# Patient Record
Sex: Male | Born: 1955
Health system: Southern US, Community
[De-identification: ages and names within clinical notes are randomized; demographics above are authoritative.]

## PROBLEM LIST (undated history)

## (undated) DIAGNOSIS — R569 Unspecified convulsions: Secondary | ICD-10-CM

## (undated) DIAGNOSIS — I10 Essential (primary) hypertension: Secondary | ICD-10-CM

## (undated) HISTORY — PX: OTHER SURGICAL HISTORY: SHX169

---

## 2014-04-28 LAB — CBC AND DIFFERENTIAL: WBC: 6.6

## 2015-09-26 LAB — HM COLONOSCOPY

## 2017-01-02 LAB — LIPID PANEL
Cholesterol: 165 (ref 0–200)
HDL: 47 (ref 35–70)
LDL Cholesterol: 106
Triglycerides: 62 (ref 40–160)

## 2017-02-17 DIAGNOSIS — N471 Phimosis: Secondary | ICD-10-CM | POA: Diagnosis not present

## 2017-03-04 DIAGNOSIS — L400 Psoriasis vulgaris: Secondary | ICD-10-CM | POA: Diagnosis not present

## 2017-04-27 DIAGNOSIS — H40013 Open angle with borderline findings, low risk, bilateral: Secondary | ICD-10-CM | POA: Diagnosis not present

## 2017-04-27 DIAGNOSIS — H524 Presbyopia: Secondary | ICD-10-CM | POA: Diagnosis not present

## 2017-04-27 DIAGNOSIS — H5203 Hypermetropia, bilateral: Secondary | ICD-10-CM | POA: Diagnosis not present

## 2017-06-17 DIAGNOSIS — M79675 Pain in left toe(s): Secondary | ICD-10-CM | POA: Diagnosis not present

## 2017-06-17 DIAGNOSIS — L03032 Cellulitis of left toe: Secondary | ICD-10-CM | POA: Diagnosis not present

## 2017-09-08 DIAGNOSIS — M25511 Pain in right shoulder: Secondary | ICD-10-CM | POA: Diagnosis not present

## 2017-09-08 DIAGNOSIS — M19011 Primary osteoarthritis, right shoulder: Secondary | ICD-10-CM | POA: Diagnosis not present

## 2017-09-17 DIAGNOSIS — M25511 Pain in right shoulder: Secondary | ICD-10-CM | POA: Diagnosis not present

## 2017-09-17 DIAGNOSIS — M19011 Primary osteoarthritis, right shoulder: Secondary | ICD-10-CM | POA: Diagnosis not present

## 2017-10-09 ENCOUNTER — Emergency Department (HOSPITAL_COMMUNITY): Payer: BLUE CROSS/BLUE SHIELD

## 2017-10-09 ENCOUNTER — Other Ambulatory Visit: Payer: Self-pay

## 2017-10-09 ENCOUNTER — Emergency Department (HOSPITAL_COMMUNITY)
Admission: EM | Admit: 2017-10-09 | Discharge: 2017-10-09 | Disposition: A | Discharge status: Home / Self Care | Payer: BLUE CROSS/BLUE SHIELD | Attending: Emergency Medicine | Admitting: Emergency Medicine

## 2017-10-09 DIAGNOSIS — W01198A Fall on same level from slipping, tripping and stumbling with subsequent striking against other object, initial encounter: Secondary | ICD-10-CM | POA: Insufficient documentation

## 2017-10-09 DIAGNOSIS — Y939 Activity, unspecified: Secondary | ICD-10-CM | POA: Insufficient documentation

## 2017-10-09 DIAGNOSIS — M7989 Other specified soft tissue disorders: Secondary | ICD-10-CM | POA: Diagnosis not present

## 2017-10-09 DIAGNOSIS — S6991XA Unspecified injury of right wrist, hand and finger(s), initial encounter: Secondary | ICD-10-CM | POA: Diagnosis not present

## 2017-10-09 DIAGNOSIS — Y92008 Other place in unspecified non-institutional (private) residence as the place of occurrence of the external cause: Secondary | ICD-10-CM | POA: Insufficient documentation

## 2017-10-09 DIAGNOSIS — Y999 Unspecified external cause status: Secondary | ICD-10-CM | POA: Diagnosis not present

## 2017-10-09 MED ORDER — IBUPROFEN 600 MG PO TABS
600.0000 mg | ORAL_TABLET | Freq: Four times a day (QID) | ORAL | 0 refills | Status: DC | PRN
Start: 2017-10-09 — End: 2018-08-04

## 2017-10-09 MED ORDER — IBUPROFEN 400 MG PO TABS
600.0000 mg | ORAL_TABLET | Freq: Once | ORAL | Status: AC
  Administered 2017-10-09: 600 mg via ORAL
  Filled 2017-10-09: qty 1
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
## 2017-10-09 NOTE — Discharge Instructions (Signed)
As discussed, your x-rays did not show any fractures or dislocation today. Follow the rice protocol outlined in these instructions.  Follow-up with your primary care provider next week. Return sooner if symptoms worsen, increased swelling, tightness, loss of sensation, worsening pain or other new concerning symptoms in the meantime.

## 2017-10-22 DIAGNOSIS — S60021A Contusion of right index finger without damage to nail, initial encounter: Secondary | ICD-10-CM | POA: Diagnosis not present

## 2017-12-24 DIAGNOSIS — S60021D Contusion of right index finger without damage to nail, subsequent encounter: Secondary | ICD-10-CM | POA: Diagnosis not present

## 2017-12-24 DIAGNOSIS — S6991XD Unspecified injury of right wrist, hand and finger(s), subsequent encounter: Secondary | ICD-10-CM | POA: Diagnosis not present

## 2017-12-31 DIAGNOSIS — S6991XD Unspecified injury of right wrist, hand and finger(s), subsequent encounter: Secondary | ICD-10-CM | POA: Diagnosis not present

## 2018-01-07 DIAGNOSIS — S60021D Contusion of right index finger without damage to nail, subsequent encounter: Secondary | ICD-10-CM | POA: Diagnosis not present

## 2018-01-07 DIAGNOSIS — S6991XD Unspecified injury of right wrist, hand and finger(s), subsequent encounter: Secondary | ICD-10-CM | POA: Diagnosis not present

## 2018-01-09 DIAGNOSIS — F4320 Adjustment disorder, unspecified: Secondary | ICD-10-CM | POA: Diagnosis not present

## 2018-01-19 ENCOUNTER — Encounter (HOSPITAL_COMMUNITY): Payer: Self-pay

## 2018-01-19 ENCOUNTER — Emergency Department (HOSPITAL_COMMUNITY)
Admission: EM | Admit: 2018-01-19 | Discharge: 2018-01-19 | Disposition: A | Discharge status: Home / Self Care | Payer: BLUE CROSS/BLUE SHIELD | Attending: Emergency Medicine | Admitting: Emergency Medicine

## 2018-01-19 ENCOUNTER — Other Ambulatory Visit: Payer: Self-pay

## 2018-01-19 DIAGNOSIS — I10 Essential (primary) hypertension: Secondary | ICD-10-CM | POA: Insufficient documentation

## 2018-01-19 DIAGNOSIS — R001 Bradycardia, unspecified: Secondary | ICD-10-CM | POA: Diagnosis not present

## 2018-01-19 DIAGNOSIS — Z79899 Other long term (current) drug therapy: Secondary | ICD-10-CM | POA: Insufficient documentation

## 2018-01-19 DIAGNOSIS — S63410A Traumatic rupture of collateral ligament of right index finger at metacarpophalangeal and interphalangeal joint, initial encounter: Secondary | ICD-10-CM | POA: Diagnosis not present

## 2018-01-19 DIAGNOSIS — Y999 Unspecified external cause status: Secondary | ICD-10-CM | POA: Diagnosis not present

## 2018-01-19 DIAGNOSIS — S63650A Sprain of metacarpophalangeal joint of right index finger, initial encounter: Secondary | ICD-10-CM | POA: Diagnosis not present

## 2018-01-19 DIAGNOSIS — R55 Syncope and collapse: Secondary | ICD-10-CM | POA: Diagnosis not present

## 2018-01-19 DIAGNOSIS — X58XXXA Exposure to other specified factors, initial encounter: Secondary | ICD-10-CM | POA: Diagnosis not present

## 2018-01-19 DIAGNOSIS — R0902 Hypoxemia: Secondary | ICD-10-CM | POA: Diagnosis not present

## 2018-01-19 DIAGNOSIS — G8918 Other acute postprocedural pain: Secondary | ICD-10-CM | POA: Diagnosis not present

## 2018-01-19 HISTORY — DX: Unspecified convulsions: R56.9

## 2018-01-19 HISTORY — DX: Essential (primary) hypertension: I10

## 2018-01-19 LAB — BASIC METABOLIC PANEL
Anion gap: 8 (ref 5–15)
BUN: 12 mg/dL (ref 6–20)
CO2: 27 mmol/L (ref 22–32)
Calcium: 8.9 mg/dL (ref 8.9–10.3)
Chloride: 104 mmol/L (ref 101–111)
Creatinine, Ser: 1.09 mg/dL (ref 0.61–1.24)
GFR calc Af Amer: >60 mL/min (ref >60)
GFR calc non Af Amer: >60 mL/min (ref >60)
Glucose, Bld: 105 mg/dL — ABNORMAL HIGH (ref 65–99)
Potassium: 3.4 mmol/L — ABNORMAL LOW (ref 3.5–5.1)
Sodium: 139 mmol/L (ref 135–145)

## 2018-01-19 LAB — URINALYSIS, ROUTINE W REFLEX MICROSCOPIC
Bilirubin Urine: NEGATIVE
Glucose, UA: NEGATIVE mg/dL
Hgb urine dipstick: NEGATIVE
Ketones, ur: NEGATIVE mg/dL
Leukocytes, UA: NEGATIVE
Nitrite: NEGATIVE
Protein, ur: NEGATIVE mg/dL
Specific Gravity, Urine: 1.015 (ref 1.005–1.030)
pH: 6 (ref 5.0–8.0)

## 2018-01-19 LAB — CBC
HCT: 42.5 % (ref 39.0–52.0)
Hemoglobin: 14.7 g/dL (ref 13.0–17.0)
MCH: 32 pg (ref 26.0–34.0)
MCHC: 34.6 g/dL (ref 30.0–36.0)
MCV: 92.6 fL (ref 78.0–100.0)
Platelets: 249 10*3/uL (ref 150–400)
RBC: 4.59 MIL/uL (ref 4.22–5.81)
RDW: 12.8 % (ref 11.5–15.5)
WBC: 7.6 10*3/uL (ref 4.0–10.5)

## 2018-01-19 LAB — TROPONIN I: Troponin I: <0.03 ng/mL (ref <0.03)

## 2018-01-19 LAB — CBG MONITORING, ED: Glucose-Capillary: 93 mg/dL (ref 65–99)

## 2018-01-19 MED ORDER — SODIUM CHLORIDE 0.9 % IV BOLUS
500.0000 mL | Freq: Once | INTRAVENOUS | Status: AC
  Administered 2018-01-19: 500 mL via INTRAVENOUS
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
## 2018-01-19 NOTE — ED Notes (Signed)
Pt provided with cup of ice water.  ?

## 2018-01-19 NOTE — ED Provider Notes (Signed)
[facility] EMERGENCY DEPARTMENT Provider Note   CSN: [id number] Arrival date & time: 01/19/18  1536     History   Chief Complaint Chief Complaint  Patient presents with  . Loss of Consciousness    HPI Lucifer Soja is a 62 y.o. male.  HPI Patient presents after syncopal episode.  Had operation on the ligaments on his finger by Dr. Orlan Leavens earlier today.  Had alfentanil propofol Versed Robaxin and 5 mg oxycodone.  Also some lidocaine.  Had not eaten today.  Went home and began to feel lightheaded and feel as if he was going to pass out.  Felt the room spinning.No chest pain.  Abrasion to left knee.  Did not hit his head.  Was orthostatic for EMS.  Had fluid bolus.  Feeling better now.  States he is thirsty however. Past Medical History:  Diagnosis Date  . Hypertension   . Seizures (HCC)     There are no active problems to display for this patient.   History reviewed. No pertinent surgical history.      Home Medications    Prior to Admission medications   Medication Sig Start Date End Date Taking? Authorizing Provider  amLODipine (NORVASC) 10 MG tablet Take 10 mg by mouth daily. 11/05/17  Yes [provider]  labetalol (NORMODYNE) 100 MG tablet Take 200 mg by mouth daily.  11/05/17  Yes [provider]  lamoTRIgine (LAMICTAL) 150 MG tablet Take 300 mg by mouth daily.  11/05/17  Yes [provider]  losartan-hydrochlorothiazide (HYZAAR) 50-12.5 MG tablet Take 2 tablets by mouth daily.  11/05/17  Yes [provider]  ibuprofen (ADVIL,MOTRIN) 600 MG tablet Take 1 tablet (600 mg total) by mouth every 6 (six) hours as needed. Patient not taking: Reported on 01/19/2018 10/09/17   Gregary Cromer    Family History History reviewed. No pertinent family history.  Social History Social History   Tobacco Use  . Smoking status: Never Smoker  . Smokeless tobacco: Never Used  Substance Use Topics  . Alcohol use: Yes   Alcohol/week: 1.8 oz    Types: 2 Glasses of wine, 1 Shots of liquor per week    Comment: socially wine or gin & tonic  . Drug use: Never     Allergies   Patient has no known allergies.   Review of Systems Review of Systems  Constitutional: Negative for appetite change.  HENT: Negative for congestion.   Respiratory: Negative for shortness of breath.   Cardiovascular: Negative for chest pain.  Gastrointestinal: Negative for abdominal pain.  Genitourinary: Negative for flank pain.  Musculoskeletal: Negative for back pain.  Skin: Negative for rash.  Neurological: Positive for dizziness and light-headedness.  Psychiatric/Behavioral: Negative for confusion.     Physical Exam Updated Vital Signs BP 131/85   Pulse 64   Temp 98.1 F (36.7 C)   Resp 13   Ht 6' (1.829 m)   Wt 117.9 kg (260 lb)   SpO2 96%   BMI 35.26 kg/m   Physical Exam  Constitutional: He appears well-developed.  HENT:  Head: Normocephalic.  Eyes: EOM are normal.  Neck: Neck supple.  Cardiovascular: Normal rate.  Abdominal: Soft. There is no tenderness.  Musculoskeletal:  Right hand and forearm in cast.  Neurological: He is alert.  Awake and appropriate.  Finger-nose intact on left side.  Has nystagmus, particularly with gaze to the right.     ED Treatments / Results  Labs (all labs ordered are listed,  but only abnormal results are displayed) Labs Reviewed  BASIC METABOLIC PANEL - Abnormal; Notable for the following components:      Result Value   Potassium 3.4 (*)    Glucose, Bld 105 (*)    All other components within normal limits  CBC  URINALYSIS, ROUTINE W REFLEX MICROSCOPIC  TROPONIN I  CBG MONITORING, ED    EKG EKG Interpretation  Date/Time:  Wednesday January 19 2018 15:42:12 EDT Ventricular Rate:  53 PR Interval:    QRS Duration: 106 QT Interval:  476 QTC Calculation: 447 R Axis:   -2 Text Interpretation:  Sinus rhythm Prolonged PR interval Abnormal R-wave progression, early  transition Confirmed by Benjiman CorePickering, Ryheem Jay [id number]) on 01/19/2018 3:48:25 PM   Radiology No results found.  Procedures Procedures (including critical care time)  Medications Ordered in ED Medications  sodium chloride 0.9 % bolus 500 mL (0 mLs Intravenous Stopped 01/19/18 1802)     Initial Impression / Assessment and Plan / ED Course  I have reviewed the triage vital signs and the nursing notes.  Pertinent labs & imaging results that were available during my care of the patient were reviewed by me and considered in my medical decision making (see chart for details).     Patient with syncopal episode.  Likely due to combination of medications and decreased oral intake.  EKG lab work reassuring.  Doubt this is ischemic or arrhythmia as the cause.  Dizziness does not appear to be stroke.  Feels better after fluids and ambulated in the ER.  Discharge home.  Final Clinical Impressions(s) / ED Diagnoses   Final diagnoses:  Syncope, unspecified syncope type    ED Discharge Orders    None      Benjiman CorePickering, Syenna Nazir, MD 01/19/18 2350

## 2018-01-19 NOTE — ED Notes (Signed)
Patient able to walk by himself with a steady gait in the hallway.

## 2018-01-19 NOTE — ED Notes (Signed)
ED Provider at bedside. 

## 2018-02-04 DIAGNOSIS — S5320XA Traumatic rupture of unspecified radial collateral ligament, initial encounter: Secondary | ICD-10-CM | POA: Diagnosis not present

## 2018-02-09 DIAGNOSIS — F4320 Adjustment disorder, unspecified: Secondary | ICD-10-CM | POA: Diagnosis not present

## 2018-02-11 ENCOUNTER — Ambulatory Visit: Payer: BLUE CROSS/BLUE SHIELD | Admitting: Family Medicine

## 2018-02-11 ENCOUNTER — Encounter: Payer: Self-pay | Admitting: Family Medicine

## 2018-02-11 VITALS — BP 138/88 | HR 78 | Temp 98.5°F | Ht 72.0 in | Wt 260.3 lb

## 2018-02-11 DIAGNOSIS — I10 Essential (primary) hypertension: Secondary | ICD-10-CM

## 2018-02-11 DIAGNOSIS — Z23 Encounter for immunization: Secondary | ICD-10-CM | POA: Diagnosis not present

## 2018-02-11 DIAGNOSIS — R739 Hyperglycemia, unspecified: Secondary | ICD-10-CM

## 2018-02-11 DIAGNOSIS — Z789 Other specified health status: Secondary | ICD-10-CM | POA: Diagnosis not present

## 2018-02-11 DIAGNOSIS — E785 Hyperlipidemia, unspecified: Secondary | ICD-10-CM | POA: Diagnosis not present

## 2018-02-11 DIAGNOSIS — R569 Unspecified convulsions: Secondary | ICD-10-CM | POA: Insufficient documentation

## 2018-02-11 DIAGNOSIS — Z1159 Encounter for screening for other viral diseases: Secondary | ICD-10-CM

## 2018-02-11 DIAGNOSIS — Z86010 Personal history of colonic polyps: Secondary | ICD-10-CM | POA: Diagnosis not present

## 2018-02-11 DIAGNOSIS — I440 Atrioventricular block, first degree: Secondary | ICD-10-CM | POA: Diagnosis not present

## 2018-02-11 DIAGNOSIS — Z114 Encounter for screening for human immunodeficiency virus [HIV]: Secondary | ICD-10-CM | POA: Diagnosis not present

## 2018-02-11 DIAGNOSIS — Z1211 Encounter for screening for malignant neoplasm of colon: Secondary | ICD-10-CM | POA: Diagnosis not present

## 2018-02-11 MED ORDER — LABETALOL HCL 100 MG PO TABS: 100.0000 mg | ORAL_TABLET | Freq: Two times a day (BID) | ORAL | 3 refills | Status: DC

## 2018-02-11 MED ORDER — AMLODIPINE BESYLATE 10 MG PO TABS: 10.0000 mg | ORAL_TABLET | Freq: Every day | ORAL | 3 refills | Status: DC

## 2018-02-11 MED ORDER — LOSARTAN POTASSIUM-HCTZ 50-12.5 MG PO TABS: 2.0000 | ORAL_TABLET | Freq: Every day | ORAL | 3 refills | Status: DC

## 2018-02-11 MED ORDER — LAMOTRIGINE 150 MG PO TABS: 300.0000 mg | ORAL_TABLET | Freq: Every day | ORAL | 3 refills | Status: DC
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
## 2018-02-11 NOTE — Patient Instructions (Addendum)
Health Maintenance Due  Topic Date Due  . Hepatitis C Screening - will review records 09-17-1955  . HIV Screening - will review records 12/17/1972  . TETANUS/TDAP - Sign release of information at the check out desk for all immunizations plus office notes and labs for last 3 years from your primary care doctor 12/17/1976  . COLONOSCOPY - Sign release of information at the check out desk or with [facility] for all colonoscopies and pathology reports. I am trying to figure out why they recommend every 5 year colonoscopies.  12/18/2007   I refilled all medicines for a year but I would like for you to come fasting to a visit for my next available physical- schedule this at the desk before you leave. Hopefully by then we have all your records in.   Continue antibiotic and glad you have follow up with hand surgery on Monday.   Wt Readings from Last 3 Encounters:  02/11/18 260 lb 4.8 oz (118.1 kg)  01/19/18 260 lb (117.9 kg)  once your hand heals up I want you to set a goal of getting down to at least 250 if not lower by follow up with me. 150 minutes of exercise per week recommended along with healthy diet such as mediterranean diet. My hope is that we can keep you off cholesterol medicines and lower your risk of diabetes by making these changes   Mediterranean Diet A Mediterranean diet refers to food and lifestyle choices that are based on the traditions of countries located on the [employer]. This way of eating has been shown to help prevent certain conditions and improve outcomes for people who have chronic diseases, like kidney disease and heart disease. What are tips for following this plan? Lifestyle  Cook and eat meals together with your family, when possible.  Drink enough fluid to keep your urine clear or pale yellow.  Be physically active every day. This includes: ? Aerobic exercise like running or swimming. ? Leisure activities like gardening, walking, or housework.  Get 7-8 hours  of sleep each night.  If recommended by your health care provider, drink red wine in moderation. This means 1 glass a day for nonpregnant women and 2 glasses a day for men. A glass of wine equals 5 oz (150 mL). Reading food labels  Check the serving size of packaged foods. For foods such as rice and pasta, the serving size refers to the amount of cooked product, not dry.  Check the total fat in packaged foods. Avoid foods that have saturated fat or trans fats.  Check the ingredients list for added sugars, such as corn syrup. Shopping  At the grocery store, buy most of your food from the areas near the walls of the store. This includes: ? Fresh fruits and vegetables (produce). ? Grains, beans, nuts, and seeds. Some of these may be available in unpackaged forms or large amounts (in bulk). ? Fresh seafood. ? Poultry and eggs. ? Low-fat dairy products.  Buy whole ingredients instead of prepackaged foods.  Buy fresh fruits and vegetables in-season from local farmers markets.  Buy frozen fruits and vegetables in resealable bags.  If you do not have access to quality fresh seafood, buy precooked frozen shrimp or canned fish, such as tuna, salmon, or sardines.  Buy small amounts of raw or cooked vegetables, salads, or olives from the deli or salad bar at your store.  Stock your pantry so you always have certain foods on hand, such as olive oil, canned  tuna, canned tomatoes, rice, pasta, and beans. Cooking  Cook foods with extra-virgin olive oil instead of using butter or other vegetable oils.  Have meat as a side dish, and have vegetables or grains as your main dish. This means having meat in small portions or adding small amounts of meat to foods like pasta or stew.  Use beans or vegetables instead of meat in common dishes like chili or lasagna.  Experiment with different cooking methods. Try roasting or broiling vegetables instead of steaming or sauteing them.  Add frozen  vegetables to soups, stews, pasta, or rice.  Add nuts or seeds for added healthy fat at each meal. You can add these to yogurt, salads, or vegetable dishes.  Marinate fish or vegetables using olive oil, lemon juice, garlic, and fresh herbs. Meal planning  Plan to eat 1 vegetarian meal one day each week. Try to work up to 2 vegetarian meals, if possible.  Eat seafood 2 or more times a week.  Have healthy snacks readily available, such as: ? Vegetable sticks with hummus. ? [place] yogurt. ? Fruit and nut trail mix.  Eat balanced meals throughout the week. This includes: ? Fruit: 2-3 servings a day ? Vegetables: 4-5 servings a day ? Low-fat dairy: 2 servings a day ? Fish, poultry, or lean meat: 1 serving a day ? Beans and legumes: 2 or more servings a week ? Nuts and seeds: 1-2 servings a day ? Whole grains: 6-8 servings a day ? Extra-virgin olive oil: 3-4 servings a day  Limit red meat and sweets to only a few servings a month What are my food choices?  Mediterranean diet ? Recommended ? Grains: Whole-grain pasta. Brown rice. Bulgar wheat. Polenta. Couscous. Whole-wheat bread. Orpah Cobbatmeal. Quinoa. ? Vegetables: Artichokes. Beets. Broccoli. Cabbage. Carrots. Eggplant. Green beans. Chard. Kale. Spinach. Onions. Leeks. Peas. Squash. Tomatoes. Peppers. Radishes. ? Fruits: Apples. Apricots. Avocado. Berries. Bananas. Cherries. Dates. Figs. Grapes. Lemons. Melon. Oranges. Peaches. Plums. Pomegranate. ? Meats and other protein foods: Beans. Almonds. Sunflower seeds. Pine nuts. Peanuts. Cod. Salmon. Scallops. Shrimp. Tuna. Tilapia. Clams. Oysters. Eggs. ? Dairy: Low-fat milk. Cheese. Greek yogurt. ? Beverages: Water. Red wine. Herbal tea. ? Fats and oils: Extra virgin olive oil. Avocado oil. Grape seed oil. ? Sweets and desserts: [place] yogurt with honey. Baked apples. Poached pears. Trail mix. ? Seasoning and other foods: Basil. Cilantro. Coriander. Cumin. Mint. Parsley. Sage. Rosemary.  Tarragon. Garlic. Oregano. Thyme. Pepper. Balsalmic vinegar. Tahini. Hummus. Tomato sauce. Olives. Mushrooms. ? Limit these ? Grains: Prepackaged pasta or rice dishes. Prepackaged cereal with added sugar. ? Vegetables: Deep fried potatoes (french fries). ? Fruits: Fruit canned in syrup. ? Meats and other protein foods: Beef. Pork. Lamb. Poultry with skin. Hot dogs. Tomasa BlaseBacon. ? Dairy: Ice cream. Sour cream. Whole milk. ? Beverages: Juice. Sugar-sweetened soft drinks. Beer. Liquor and spirits. ? Fats and oils: Butter. Canola oil. Vegetable oil. Beef fat (tallow). Lard. ? Sweets and desserts: Cookies. Cakes. Pies. Candy. ? Seasoning and other foods: Mayonnaise. Premade sauces and marinades. ? The items listed may not be a complete list. Talk with your dietitian about what dietary choices are right for you. Summary  The Mediterranean diet includes both food and lifestyle choices.  Eat a variety of fresh fruits and vegetables, beans, nuts, seeds, and whole grains.  Limit the amount of red meat and sweets that you eat.  Talk with your health care provider about whether it is safe for you to drink red wine in moderation. This means  1 glass a day for nonpregnant women and 2 glasses a day for men. A glass of wine equals 5 oz (150 mL). This information is not intended to replace advice given to you by your health care provider. Make sure you discuss any questions you have with your health care provider. Document Released: 03/12/2016 Document Revised: 04/14/2016 Document Reviewed: 03/12/2016 Elsevier Interactive Patient Education  [employer].

## 2018-02-12 DIAGNOSIS — R739 Hyperglycemia, unspecified: Secondary | ICD-10-CM | POA: Insufficient documentation

## 2018-02-12 DIAGNOSIS — I440 Atrioventricular block, first degree: Secondary | ICD-10-CM | POA: Insufficient documentation

## 2018-02-12 DIAGNOSIS — Z860100 Personal history of colon polyps, unspecified: Secondary | ICD-10-CM | POA: Insufficient documentation

## 2018-02-12 DIAGNOSIS — E785 Hyperlipidemia, unspecified: Secondary | ICD-10-CM | POA: Insufficient documentation

## 2018-02-12 DIAGNOSIS — Z789 Other specified health status: Secondary | ICD-10-CM | POA: Insufficient documentation

## 2018-02-12 NOTE — Assessment & Plan Note (Signed)
S: controlled on Labetalol 100 BID (he was actually taking all at once but we discussed half life of drug and that he should take BID), losartan-hctz 50-12.5mg  BP Readings from Last 3 Encounters:  02/11/18 138/88  01/19/18 131/85  10/09/17 117/82  A/P: We discussed blood pressure goal of <140/90. Continue current meds:  Refilled all of these.

## 2018-02-12 NOTE — Assessment & Plan Note (Signed)
Wife HCPOA Britta MccreedyBarbara and daughter Cala BradfordKimberly and son Lodema Honghilipp as back up. to be scanned in. Full code
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
## 2018-02-12 NOTE — Assessment & Plan Note (Addendum)
Hyperglycemia on old labs. Get a1c at CPE as well as cbg. Discussed importance of weight loss

## 2018-02-12 NOTE — Assessment & Plan Note (Signed)
On records only see hyperplastic polyps but was advised q5 year colonoscopy- will try to get records to understand this better. Perhaps had adenomatous polyp in the past.

## 2018-02-12 NOTE — Assessment & Plan Note (Addendum)
S: poorly controlled on no rx A/P: mild LDL elevations- we will get these abstracted but also likely update at CPE and update 10 year ascvd risks  Patient also brings an old EKG and asks me to compare to one from ER recently. Both show poor r wave progression. Patient is asymptomatic- will not pursue cardiac workup at this time. Does have first degree block- no major concern

## 2018-02-13 ENCOUNTER — Encounter: Payer: Self-pay | Admitting: Family Medicine

## 2018-02-14 ENCOUNTER — Encounter: Payer: Self-pay | Admitting: Family Medicine

## 2018-03-01 DIAGNOSIS — F4320 Adjustment disorder, unspecified: Secondary | ICD-10-CM | POA: Diagnosis not present

## 2018-03-04 DIAGNOSIS — S5320XD Traumatic rupture of unspecified radial collateral ligament, subsequent encounter: Secondary | ICD-10-CM | POA: Diagnosis not present

## 2018-03-11 DIAGNOSIS — S5320XA Traumatic rupture of unspecified radial collateral ligament, initial encounter: Secondary | ICD-10-CM | POA: Diagnosis not present

## 2018-03-17 DIAGNOSIS — S5320XD Traumatic rupture of unspecified radial collateral ligament, subsequent encounter: Secondary | ICD-10-CM | POA: Diagnosis not present

## 2018-03-21 ENCOUNTER — Encounter: Payer: Self-pay | Admitting: Family Medicine

## 2018-03-25 DIAGNOSIS — S5320XD Traumatic rupture of unspecified radial collateral ligament, subsequent encounter: Secondary | ICD-10-CM | POA: Diagnosis not present

## 2018-03-29 DIAGNOSIS — S60021D Contusion of right index finger without damage to nail, subsequent encounter: Secondary | ICD-10-CM | POA: Diagnosis not present

## 2018-03-29 DIAGNOSIS — M25512 Pain in left shoulder: Secondary | ICD-10-CM | POA: Diagnosis not present

## 2018-03-29 DIAGNOSIS — M19011 Primary osteoarthritis, right shoulder: Secondary | ICD-10-CM | POA: Diagnosis not present

## 2018-03-29 DIAGNOSIS — M19012 Primary osteoarthritis, left shoulder: Secondary | ICD-10-CM | POA: Diagnosis not present

## 2018-04-01 DIAGNOSIS — S5320XD Traumatic rupture of unspecified radial collateral ligament, subsequent encounter: Secondary | ICD-10-CM | POA: Diagnosis not present

## 2018-04-22 ENCOUNTER — Encounter: Payer: BLUE CROSS/BLUE SHIELD | Admitting: Family Medicine

## 2018-04-25 ENCOUNTER — Telehealth: Payer: Self-pay | Admitting: Family Medicine

## 2018-04-25 MED ORDER — LABETALOL HCL 100 MG PO TABS: 100.0000 mg | ORAL_TABLET | Freq: Two times a day (BID) | ORAL | 3 refills | Status: DC

## 2018-04-25 MED ORDER — AMLODIPINE BESYLATE 10 MG PO TABS: 10.0000 mg | ORAL_TABLET | Freq: Every day | ORAL | 3 refills | Status: DC

## 2018-04-25 MED ORDER — LOSARTAN POTASSIUM-HCTZ 50-12.5 MG PO TABS
2.0000 | ORAL_TABLET | Freq: Every day | ORAL | 3 refills | Status: DC
Start: 2018-04-25 — End: 2018-09-07

## 2018-04-25 MED ORDER — LAMOTRIGINE 150 MG PO TABS: 300.0000 mg | ORAL_TABLET | Freq: Every day | ORAL | 3 refills | Status: DC

## 2018-04-25 NOTE — Telephone Encounter (Signed)
Copied from CRM [id number]. Topic: Quick Communication - Rx Refill/Question >> Apr 25, 2018  9:19 AM Angela NevinWilliams, Candice N wrote: Medication: amLODipine (NORVASC) 10 MG tablet, labetalol (NORMODYNE) 100 MG tablet, lamoTRIgine (LAMICTAL) 150 MG tablet and losartan-hydrochlorothiazide (HYZAAR) 50-12.5 MG tablet    Preferred Pharmacy (with phone number or street name): [facility] [place] [address] [phone] (Phone) [phone] (Fax)    Agent: Please be advised that RX refills may take up to 3 business days. We ask that you follow-up with your pharmacy.

## 2018-05-05 ENCOUNTER — Encounter: Payer: Self-pay | Admitting: Family Medicine

## 2018-05-09 ENCOUNTER — Encounter: Payer: Self-pay | Admitting: Family Medicine

## 2018-05-09 DIAGNOSIS — L719 Rosacea, unspecified: Secondary | ICD-10-CM | POA: Insufficient documentation

## 2018-05-09 MED ORDER — AZELAIC ACID 15 % EX GEL: CUTANEOUS | 2 refills | Status: DC

## 2018-07-25 DIAGNOSIS — M19011 Primary osteoarthritis, right shoulder: Secondary | ICD-10-CM | POA: Diagnosis not present

## 2018-07-25 DIAGNOSIS — M19012 Primary osteoarthritis, left shoulder: Secondary | ICD-10-CM | POA: Diagnosis not present

## 2018-08-04 ENCOUNTER — Ambulatory Visit (INDEPENDENT_AMBULATORY_CARE_PROVIDER_SITE_OTHER): Payer: BLUE CROSS/BLUE SHIELD | Admitting: Family Medicine

## 2018-08-04 ENCOUNTER — Encounter: Payer: Self-pay | Admitting: Family Medicine

## 2018-08-04 VITALS — BP 133/85 | HR 82 | Temp 98.1°F | Ht 72.0 in | Wt 264.0 lb

## 2018-08-04 DIAGNOSIS — R569 Unspecified convulsions: Secondary | ICD-10-CM

## 2018-08-04 DIAGNOSIS — I10 Essential (primary) hypertension: Secondary | ICD-10-CM | POA: Diagnosis not present

## 2018-08-04 DIAGNOSIS — Z0000 Encounter for general adult medical examination without abnormal findings: Secondary | ICD-10-CM

## 2018-08-04 DIAGNOSIS — Z1159 Encounter for screening for other viral diseases: Secondary | ICD-10-CM

## 2018-08-04 DIAGNOSIS — Z114 Encounter for screening for human immunodeficiency virus [HIV]: Secondary | ICD-10-CM

## 2018-08-04 DIAGNOSIS — E785 Hyperlipidemia, unspecified: Secondary | ICD-10-CM | POA: Diagnosis not present

## 2018-08-04 DIAGNOSIS — M19019 Primary osteoarthritis, unspecified shoulder: Secondary | ICD-10-CM | POA: Insufficient documentation

## 2018-08-04 DIAGNOSIS — M19012 Primary osteoarthritis, left shoulder: Secondary | ICD-10-CM

## 2018-08-04 DIAGNOSIS — Z23 Encounter for immunization: Secondary | ICD-10-CM | POA: Diagnosis not present

## 2018-08-04 DIAGNOSIS — R739 Hyperglycemia, unspecified: Secondary | ICD-10-CM

## 2018-08-04 DIAGNOSIS — Z125 Encounter for screening for malignant neoplasm of prostate: Secondary | ICD-10-CM

## 2018-08-04 DIAGNOSIS — M19011 Primary osteoarthritis, right shoulder: Secondary | ICD-10-CM

## 2018-08-04 MED ORDER — OMEPRAZOLE 20 MG PO CPDR: 20.0000 mg | DELAYED_RELEASE_CAPSULE | Freq: Every day | ORAL | 3 refills | Status: DC

## 2018-08-04 NOTE — Addendum Note (Signed)
Addended by: Waymon Amato R on: 08/04/2018 03:10 PM   Modules accepted: Orders

## 2018-08-04 NOTE — Patient Instructions (Addendum)
Health Maintenance Due  Topic Date Due  . Hepatitis C Screening - with labs 01-05-1956  . HIV Screening - with labs 12/17/1972  . TETANUS/TDAP - good for 10 years, update today 12/17/1976   Shingrix #1 today. Repeat injection in 2-5 months. Schedule a nurse visit for the 2nd injection before you leave today (at the check out desk)  Schedule a lab visit at the check out desk within 2 weeks. Return for future fasting labs meaning nothing but water after midnight please. Ok to take your medications with water.   6 month follow up- weight and BP check suggested  Trial omeprazole 20 mg for cough due to reflux-if does not resolve within 2 to 4 weeks please see [place] back for repeat evaluation

## 2018-08-04 NOTE — Addendum Note (Signed)
Addended by: Vicente Males on: 08/04/2018 03:29 PM   Modules accepted: Orders
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
## 2018-08-05 ENCOUNTER — Other Ambulatory Visit (INDEPENDENT_AMBULATORY_CARE_PROVIDER_SITE_OTHER): Payer: BLUE CROSS/BLUE SHIELD

## 2018-08-05 ENCOUNTER — Telehealth: Payer: Self-pay | Admitting: Family Medicine

## 2018-08-05 DIAGNOSIS — Z1159 Encounter for screening for other viral diseases: Secondary | ICD-10-CM

## 2018-08-05 DIAGNOSIS — Z125 Encounter for screening for malignant neoplasm of prostate: Secondary | ICD-10-CM | POA: Diagnosis not present

## 2018-08-05 DIAGNOSIS — Z114 Encounter for screening for human immunodeficiency virus [HIV]: Secondary | ICD-10-CM

## 2018-08-05 DIAGNOSIS — E785 Hyperlipidemia, unspecified: Secondary | ICD-10-CM

## 2018-08-05 DIAGNOSIS — R569 Unspecified convulsions: Secondary | ICD-10-CM | POA: Diagnosis not present

## 2018-08-05 DIAGNOSIS — R739 Hyperglycemia, unspecified: Secondary | ICD-10-CM | POA: Diagnosis not present

## 2018-08-05 DIAGNOSIS — Z0000 Encounter for general adult medical examination without abnormal findings: Secondary | ICD-10-CM

## 2018-08-05 LAB — CBC
HCT: 42.5 % (ref 39.0–52.0)
Hemoglobin: 14.8 g/dL (ref 13.0–17.0)
MCHC: 34.9 g/dL (ref 30.0–36.0)
MCV: 93.7 fl (ref 78.0–100.0)
Platelets: 229 10*3/uL (ref 150.0–400.0)
RBC: 4.53 Mil/uL (ref 4.22–5.81)
RDW: 13.2 % (ref 11.5–15.5)
WBC: 13.2 10*3/uL — ABNORMAL HIGH (ref 4.0–10.5)

## 2018-08-05 LAB — COMPREHENSIVE METABOLIC PANEL
ALT: 18 U/L (ref 0–53)
AST: 15 U/L (ref 0–37)
Albumin: 4.2 g/dL (ref 3.5–5.2)
Alkaline Phosphatase: 70 U/L (ref 39–117)
BUN: 15 mg/dL (ref 6–23)
CO2: 30 mEq/L (ref 19–32)
Calcium: 9.2 mg/dL (ref 8.4–10.5)
Chloride: 102 mEq/L (ref 96–112)
Creatinine, Ser: 1.06 mg/dL (ref 0.40–1.50)
GFR: 75.58 mL/min (ref >60.00)
Glucose, Bld: 98 mg/dL (ref 70–99)
Potassium: 3.7 mEq/L (ref 3.5–5.1)
Sodium: 141 mEq/L (ref 135–145)
Total Bilirubin: 0.8 mg/dL (ref 0.2–1.2)
Total Protein: 6.6 g/dL (ref 6.0–8.3)

## 2018-08-05 LAB — LIPID PANEL
Cholesterol: 165 mg/dL (ref 0–200)
HDL: 44.6 mg/dL (ref >39.00)
LDL Cholesterol: 104 mg/dL — ABNORMAL HIGH (ref 0–99)
NonHDL: 120.14
Total CHOL/HDL Ratio: 4
Triglycerides: 83 mg/dL (ref 0.0–149.0)
VLDL: 16.6 mg/dL (ref 0.0–40.0)

## 2018-08-05 LAB — PSA: PSA: 0.53 ng/mL (ref 0.10–4.00)

## 2018-08-05 LAB — HEMOGLOBIN A1C: Hgb A1c MFr Bld: 5.2 % (ref 4.6–6.5)

## 2018-08-05 NOTE — Telephone Encounter (Signed)
Just FYI if he calls back- I spoke with him in the hall and told him if not doing better on omeprazole within a week I would sent that in for him.

## 2018-08-05 NOTE — Telephone Encounter (Signed)
Please advise 

## 2018-08-05 NOTE — Telephone Encounter (Signed)
Pt stated he was seen 08/04/18 and forgot he needed a rx for cough syrup with codeine sent to pharmacy. Please advise.

## 2018-08-08 ENCOUNTER — Other Ambulatory Visit: Payer: Self-pay

## 2018-08-08 LAB — LAMOTRIGINE LEVEL: Lamotrigine Lvl: 4.8 ug/mL (ref 4.0–18.0)

## 2018-08-08 LAB — HEPATITIS C ANTIBODY
Hepatitis C Ab: NONREACTIVE
SIGNAL TO CUT-OFF: 0.01 (ref <1.00)

## 2018-08-08 LAB — HIV ANTIBODY (ROUTINE TESTING W REFLEX): HIV 1&2 Ab, 4th Generation: NONREACTIVE

## 2018-08-09 NOTE — Telephone Encounter (Signed)
Jonathon Patel- need a pharmacy to be able to send anything in. I can send tessalon (I assume what he is requesting) and [place] a pharmacy

## 2018-08-09 NOTE — Telephone Encounter (Signed)
Patient called to verify he received his lab results. He is coughing as we are talking and he requesting cough medication. Patient states he was told he could call for Rx. Patient would like Rx called to local [facility]. [place] this time and he wants to get the Rx transferred. I don't know if they can transfer the cough syrup- but he is also mentioning some pills his wife was given and states he would like to have those on hand too. Told him I would send his request for PCP review.

## 2018-08-09 NOTE — Telephone Encounter (Signed)
See note

## 2018-08-10 ENCOUNTER — Telehealth: Payer: Self-pay | Admitting: Family Medicine

## 2018-08-10 ENCOUNTER — Other Ambulatory Visit: Payer: Self-pay

## 2018-08-10 MED ORDER — AZELAIC ACID 15 % EX GEL: CUTANEOUS | 1 refills | Status: DC

## 2018-08-10 MED ORDER — GUAIFENESIN-CODEINE 100-10 MG/5ML PO SOLN: 5.0000 mL | Freq: Four times a day (QID) | ORAL | 0 refills | Status: DC | PRN

## 2018-08-10 MED ORDER — BENZONATATE 100 MG PO CAPS: 100.0000 mg | ORAL_CAPSULE | Freq: Two times a day (BID) | ORAL | 0 refills | Status: DC | PRN

## 2018-08-10 NOTE — Telephone Encounter (Signed)
I sent these-please inform patient

## 2018-08-10 NOTE — Telephone Encounter (Signed)
Called and left patient a voicemail message letting him know. Provided a call back number for questions

## 2018-08-10 NOTE — Addendum Note (Signed)
Addended by: Shelva Majestic on: 08/10/2018 04:23 PM   Modules accepted: Orders

## 2018-08-10 NOTE — Telephone Encounter (Signed)
See note  Copied from CRM [phone]. Topic: General - Other >> Aug 10, 2018 12:51 PM Marylen PontoMcneil, Ja-Kwan wrote: Reason for CRM: Pt called in requesting to speak with Asher MuirJamie. Pt stated that he would like the Rx for the pills that his wife had to [id number] [place] [place] - [address] AT [facility] [address]  [phone] (Phone)  [phone] (Fax) and he would like the Rx for the cough syrup to be [facility]  #[id number] [facility] [place] [place] - [address] AT [facility] [address] [facility] [address] and he will pick it up when he returns. Pt requests call back. Cb# [phone]

## 2018-09-07 ENCOUNTER — Other Ambulatory Visit: Payer: Self-pay

## 2018-09-07 MED ORDER — LAMOTRIGINE 150 MG PO TABS: 300.0000 mg | ORAL_TABLET | Freq: Every day | ORAL | 3 refills | Status: DC

## 2018-09-07 MED ORDER — LABETALOL HCL 100 MG PO TABS: 100.0000 mg | ORAL_TABLET | Freq: Two times a day (BID) | ORAL | 3 refills | Status: DC

## 2018-09-07 MED ORDER — AMLODIPINE BESYLATE 10 MG PO TABS: 10.0000 mg | ORAL_TABLET | Freq: Every day | ORAL | 3 refills | Status: DC

## 2018-09-07 MED ORDER — LOSARTAN POTASSIUM-HCTZ 50-12.5 MG PO TABS: 2.0000 | ORAL_TABLET | Freq: Every day | ORAL | 3 refills | Status: DC

## 2018-09-08 ENCOUNTER — Other Ambulatory Visit: Payer: Self-pay

## 2018-09-08 MED ORDER — HYDROCHLOROTHIAZIDE 12.5 MG PO CAPS: 12.5000 mg | ORAL_CAPSULE | Freq: Two times a day (BID) | ORAL | 3 refills | Status: DC

## 2018-09-08 MED ORDER — LOSARTAN POTASSIUM 50 MG PO TABS: 50.0000 mg | ORAL_TABLET | Freq: Two times a day (BID) | ORAL | 3 refills | Status: DC

## 2019-08-06 ENCOUNTER — Encounter: Payer: Self-pay | Admitting: Family Medicine

## 2019-08-11 ENCOUNTER — Other Ambulatory Visit: Payer: Self-pay

## 2019-08-11 MED ORDER — LOSARTAN POTASSIUM-HCTZ 50-12.5 MG PO TABS: 2.0000 | ORAL_TABLET | Freq: Every day | ORAL | 3 refills | Status: DC

## 2019-08-11 MED ORDER — AMLODIPINE BESYLATE 10 MG PO TABS: 10.0000 mg | ORAL_TABLET | Freq: Every day | ORAL | 3 refills | Status: DC

## 2019-08-11 MED ORDER — LAMOTRIGINE 150 MG PO TABS: 300.0000 mg | ORAL_TABLET | Freq: Every day | ORAL | 3 refills | Status: DC

## 2019-08-11 MED ORDER — LABETALOL HCL 100 MG PO TABS: 100.0000 mg | ORAL_TABLET | Freq: Two times a day (BID) | ORAL | 3 refills | Status: DC

## 2019-10-02 ENCOUNTER — Other Ambulatory Visit: Payer: Self-pay | Admitting: Family Medicine

## 2019-10-12 ENCOUNTER — Ambulatory Visit: Payer: BLUE CROSS/BLUE SHIELD

## 2020-01-10 ENCOUNTER — Other Ambulatory Visit: Payer: Self-pay | Admitting: Orthopedic Surgery

## 2020-01-10 DIAGNOSIS — M25112 Fistula, left shoulder: Secondary | ICD-10-CM

## 2020-01-16 ENCOUNTER — Encounter: Payer: Self-pay | Admitting: Family Medicine

## 2020-01-18 ENCOUNTER — Encounter: Payer: Self-pay | Admitting: Family Medicine

## 2020-01-26 ENCOUNTER — Ambulatory Visit
Admission: RE | Admit: 2020-01-26 | Discharge: 2020-01-26 | Disposition: A | Discharge status: Home / Self Care | Payer: Managed Care, Other (non HMO) | Source: Ambulatory Visit | Attending: Orthopedic Surgery | Admitting: Orthopedic Surgery

## 2020-01-26 ENCOUNTER — Other Ambulatory Visit: Payer: Self-pay

## 2020-01-26 DIAGNOSIS — M25112 Fistula, left shoulder: Secondary | ICD-10-CM

## 2020-01-27 ENCOUNTER — Encounter: Payer: Self-pay | Admitting: Family Medicine

## 2020-01-30 NOTE — Progress Notes (Signed)
Phone [phone] In person visit   Subjective:   Jonathon Patel is a 64 y.o. year old very pleasant male patient who presents for/with See problem oriented charting Chief Complaint  Patient presents with  . Follow-up    This visit occurred during the SARS-CoV-2 public health emergency.  Safety protocols were in place, including screening questions prior to the visit, additional usage of staff PPE, and extensive cleaning of exam room while observing appropriate contact time as indicated for disinfecting solutions.   Past Medical History-  Patient Active Problem List   Diagnosis Date Noted  . Thoracic aortic aneurysm (HCC) 01/31/2020    Priority: High  . History of colon polyps 02/12/2018    Priority: High  . Seizures (HCC)     Priority: High  . Osteoarthritis, shoulder 08/04/2018    Priority: Medium  . Rosacea 05/09/2018    Priority: Medium  . Hyperlipidemia 02/12/2018    Priority: Medium  . Hyperglycemia, unspecified 02/12/2018    Priority: Medium  . Hypertension     Priority: Medium  . 1st degree AV block 02/12/2018    Priority: Low  . Full code status 02/12/2018    Priority: Low    Medications- reviewed and updated Current Outpatient Medications  Medication Sig Dispense Refill  . amLODipine (NORVASC) 10 MG tablet Take 1 tablet (10 mg total) by mouth daily. 90 tablet 3  . Azelaic Acid 15 % cream Wash and pat dry skin. Afterwards- gently but thoroughly massage a thin film of azelaic acid cream into the affected area twice daily 50 g 1  . betamethasone dipropionate 0.05 % cream betamethasone dipropionate 0.05 % topical cream  APP EXT AA BID PRF FLARES    . labetalol (NORMODYNE) 100 MG tablet Take 1 tablet (100 mg total) by mouth 2 (two) times daily. 180 tablet 3  . lamoTRIgine (LAMICTAL) 150 MG tablet Take 2 tablets (300 mg total) by mouth daily. 180 tablet 3  . omeprazole (PRILOSEC) 20 MG capsule TAKE 1 CAPSULE(20 MG) BY MOUTH DAILY 90 capsule 3  . chlorthalidone  (HYGROTON) 25 MG tablet Take 1 tablet (25 mg total) by mouth daily. 90 tablet 3  . losartan (COZAAR) 100 MG tablet Take 1 tablet (100 mg total) by mouth daily. 90 tablet 3   No current facility-administered medications for this visit.     Objective:  BP 120/78   Pulse 81   Temp 99.2 F (37.3 C)   Ht 6' (1.829 m)   Wt 270 lb 3.2 oz (122.6 kg)   SpO2 93%   BMI 36.65 kg/m  Gen: NAD, resting comfortably CV: RRR no murmurs rubs or gallops Lungs: CTAB no crackles, wheeze, rhonchi Abdomen: soft/nontender/nondistended/normal bowel sounds. No rebound or guarding.  Ext: 1+ edema Skin: warm, dry     Assessment and Plan   #Thoracic aortic aneurysm S: Patient was having CT scan of the shoulder with Dr. Ave Filter on January 26, 2020.  Had incidental finding of the following.  He is asymptomatic  "Ascending thoracic aortic aneurysm measuring 4.3 cm. Recommend annual imaging followup by CTA or MRA. "  A/P: We discussed diagnosis of thoracic aortic aneurysm -We discussed avoiding heavy lifting/intense training -Patient does get tired when walking for about 15 minutes and we discussed doing this on a more regular basis.  Has some mild shortness of breath with this and if this does not improve with regular exercise (he admits to being very sedentary) and we could consider further work-up or cardiology referral -Target systolic  blood pressure between 105 and 120 systolic.  Slightly above that today-see hypertension section -We discussed 6 months versus 1 year follow-up since this was first detection-he prefers 6 months and we will order this at his physical -Discussed possible cardiothoracic surgery or vascular surgery referral-he would like to hold off for now  #hypertension S: medication: Amlodipine 10 mg,  labetalol 100 mg twice daily, losartan hctz 100-25 mg Home readings #s: typically in 140s at home.  BP Readings from Last 3 Encounters:  01/31/20 120/78  08/04/18 133/85  02/11/18 138/88   A/P: Home readings above goal.  In office on repeat was above goal.  We are going to stop losartan hydrochlorothiazide.  Continue losartan 100 mg.  Start chlorthalidone 25 mg and follow-up with physical  #hyperlipidemia S: Medication: None Lab Results  Component Value Date   CHOL 165 08/05/2018   HDL 44.60 08/05/2018   LDLCALC 104 (H) 08/05/2018   TRIG 83.0 08/05/2018   CHOLHDL 4 08/05/2018   A/P: We are going to update lipid panel.  I am getting consider a lower threshold for statin due to thoracic aneurysm-perhaps 5 to 7.5% 10-year ASCVD risk   # Hyperglycemia/insulin resistance/prediabetes-A1c has been okay in the past but blood sugars have been slightly elevated.  Will check A1c with labs.  He has an upcoming physical and he will come by for blood work within 2 weeks   #Seizures-no recurrence on Lamictal   Recommended follow up: Physical in August-recheck blood pressure at that time plus he will monitor at home Future Appointments  Date Time Provider Department Center  03/26/2020 10:00 AM Shelva Majestic, MD [facility]   Lab/Order associations:   ICD-10-CM   1. Hyperlipidemia, unspecified hyperlipidemia type  E78.5 CBC with Differential/Platelet    Comprehensive metabolic panel    Lipid panel  2. Screening for prostate cancer  Z12.5 PSA  3. Hyperglycemia  R73.9 Hemoglobin A1c  4. Preventative health care  Z00.00 CBC with Differential/Platelet    Comprehensive metabolic panel    PSA    Hemoglobin A1c    Lipid panel  5. Thoracic aortic aneurysm without rupture (HCC)  I71.2     Meds ordered this encounter  Medications  . losartan (COZAAR) 100 MG tablet    Sig: Take 1 tablet (100 mg total) by mouth daily.    Dispense:  90 tablet    Refill:  3  . chlorthalidone (HYGROTON) 25 MG tablet    Sig: Take 1 tablet (25 mg total) by mouth daily.    Dispense:  90 tablet    Refill:  3    Return precautions advised.  Tana Conch, MD

## 2020-01-31 ENCOUNTER — Encounter: Payer: Self-pay | Admitting: Family Medicine

## 2020-01-31 ENCOUNTER — Ambulatory Visit (INDEPENDENT_AMBULATORY_CARE_PROVIDER_SITE_OTHER): Payer: Managed Care, Other (non HMO) | Admitting: Family Medicine

## 2020-01-31 ENCOUNTER — Other Ambulatory Visit: Payer: Self-pay

## 2020-01-31 VITALS — BP 128/82 | HR 81 | Temp 99.2°F | Ht 72.0 in | Wt 270.2 lb

## 2020-01-31 DIAGNOSIS — I712 Thoracic aortic aneurysm, without rupture, unspecified: Secondary | ICD-10-CM | POA: Insufficient documentation

## 2020-01-31 DIAGNOSIS — I10 Essential (primary) hypertension: Secondary | ICD-10-CM | POA: Diagnosis not present

## 2020-01-31 DIAGNOSIS — E785 Hyperlipidemia, unspecified: Secondary | ICD-10-CM | POA: Diagnosis not present

## 2020-01-31 DIAGNOSIS — R739 Hyperglycemia, unspecified: Secondary | ICD-10-CM

## 2020-01-31 DIAGNOSIS — Z0000 Encounter for general adult medical examination without abnormal findings: Secondary | ICD-10-CM

## 2020-01-31 DIAGNOSIS — Z125 Encounter for screening for malignant neoplasm of prostate: Secondary | ICD-10-CM

## 2020-01-31 MED ORDER — CHLORTHALIDONE 25 MG PO TABS: 25.0000 mg | ORAL_TABLET | Freq: Every day | ORAL | 3 refills | Status: DC

## 2020-01-31 MED ORDER — LOSARTAN POTASSIUM 100 MG PO TABS: 100.0000 mg | ORAL_TABLET | Freq: Every day | ORAL | 3 refills | Status: DC

## 2020-01-31 NOTE — Assessment & Plan Note (Signed)
S: Medication: None Lab Results  Component Value Date   CHOL 165 08/05/2018   HDL 44.60 08/05/2018   LDLCALC 104 (H) 08/05/2018   TRIG 83.0 08/05/2018   CHOLHDL 4 08/05/2018   A/P: We are going to update lipid panel.  I am getting consider a lower threshold for statin due to thoracic aneurysm-perhaps 5 to 7.5% 10-year ASCVD risk

## 2020-01-31 NOTE — Assessment & Plan Note (Signed)
S: medication: Amlodipine 10 mg,  labetalol 100 mg twice daily, losartan hctz 100-25 mg Home readings #s: typically in 140s at home.  BP Readings from Last 3 Encounters:  01/31/20 120/78  08/04/18 133/85  02/11/18 138/88  A/P: Home readings above goal.  In office on repeat was above goal.  We are going to stop losartan hydrochlorothiazide.  Continue losartan 100 mg.  Start chlorthalidone 25 mg and follow-up with physical

## 2020-01-31 NOTE — Patient Instructions (Addendum)
Stop losartan hydrochlorothiazide combination pill  Start losartan 100 mg in the evening  Start chlorthalidone 25 mg in the morning  Continue amlodipine at night. Continue labetalol twice a day.   Continue to monitor blood pressure at home and bring list and cuff with you to next visit.   Schedule a lab visit at the check out desk in about 2 weeks. Return for future fasting labs meaning nothing but water after midnight please. Ok to take your medications with water.   At physical we will plan on ordering your next CT scan. We can consider vascular/cardiothoracic consult.

## 2020-01-31 NOTE — Assessment & Plan Note (Signed)
#  Thoracic aortic aneurysm S: Patient was having CT scan of the shoulder with Dr. Ave Filter on January 26, 2020.  Had incidental finding of the following.  He is asymptomatic  "Ascending thoracic aortic aneurysm measuring 4.3 cm. Recommend annual imaging followup by CTA or MRA. "  A/P: We discussed diagnosis of thoracic aortic aneurysm -We discussed avoiding heavy lifting/intense training -Patient does get tired when walking for about 15 minutes and we discussed doing this on a more regular basis.  Has some mild shortness of breath with this and if this does not improve with regular exercise (he admits to being very sedentary) and we could consider further work-up or cardiology referral -Target systolic blood pressure between 105 and 120 systolic.  Slightly above that today-see hypertension section -We discussed 6 months versus 1 year follow-up since this was first detection-he prefers 6 months and we will order this at his physical -Discussed possible cardiothoracic surgery or vascular surgery referral-he would like to hold off for now.

## 2020-02-07 ENCOUNTER — Other Ambulatory Visit: Payer: Self-pay

## 2020-02-07 ENCOUNTER — Telehealth: Payer: Self-pay | Admitting: Family Medicine

## 2020-02-07 ENCOUNTER — Other Ambulatory Visit (INDEPENDENT_AMBULATORY_CARE_PROVIDER_SITE_OTHER): Payer: Managed Care, Other (non HMO)

## 2020-02-07 DIAGNOSIS — E785 Hyperlipidemia, unspecified: Secondary | ICD-10-CM | POA: Diagnosis not present

## 2020-02-07 DIAGNOSIS — R739 Hyperglycemia, unspecified: Secondary | ICD-10-CM | POA: Diagnosis not present

## 2020-02-07 DIAGNOSIS — Z0000 Encounter for general adult medical examination without abnormal findings: Secondary | ICD-10-CM | POA: Diagnosis not present

## 2020-02-07 DIAGNOSIS — Z125 Encounter for screening for malignant neoplasm of prostate: Secondary | ICD-10-CM

## 2020-02-07 LAB — COMPREHENSIVE METABOLIC PANEL
ALT: 27 U/L (ref 0–53)
AST: 20 U/L (ref 0–37)
Albumin: 4.6 g/dL (ref 3.5–5.2)
Alkaline Phosphatase: 68 U/L (ref 39–117)
BUN: 12 mg/dL (ref 6–23)
CO2: 29 mEq/L (ref 19–32)
Calcium: 9.6 mg/dL (ref 8.4–10.5)
Chloride: 102 mEq/L (ref 96–112)
Creatinine, Ser: 1.09 mg/dL (ref 0.40–1.50)
GFR: 68.52 mL/min (ref >60.00)
Glucose, Bld: 114 mg/dL — ABNORMAL HIGH (ref 70–99)
Potassium: 3.4 mEq/L — ABNORMAL LOW (ref 3.5–5.1)
Sodium: 140 mEq/L (ref 135–145)
Total Bilirubin: 0.8 mg/dL (ref 0.2–1.2)
Total Protein: 6.9 g/dL (ref 6.0–8.3)

## 2020-02-07 LAB — CBC WITH DIFFERENTIAL/PLATELET
Basophils Absolute: 0.1 10*3/uL (ref 0.0–0.1)
Basophils Relative: 0.7 % (ref 0.0–3.0)
Eosinophils Absolute: 0.2 10*3/uL (ref 0.0–0.7)
Eosinophils Relative: 3.2 % (ref 0.0–5.0)
HCT: 44.6 % (ref 39.0–52.0)
Hemoglobin: 15.7 g/dL (ref 13.0–17.0)
Lymphocytes Relative: 20 % (ref 12.0–46.0)
Lymphs Abs: 1.4 10*3/uL (ref 0.7–4.0)
MCHC: 35.1 g/dL (ref 30.0–36.0)
MCV: 94.5 fl (ref 78.0–100.0)
Monocytes Absolute: 0.6 10*3/uL (ref 0.1–1.0)
Monocytes Relative: 7.8 % (ref 3.0–12.0)
Neutro Abs: 4.9 10*3/uL (ref 1.4–7.7)
Neutrophils Relative %: 68.3 % (ref 43.0–77.0)
Platelets: 246 10*3/uL (ref 150.0–400.0)
RBC: 4.72 Mil/uL (ref 4.22–5.81)
RDW: 13.2 % (ref 11.5–15.5)
WBC: 7.3 10*3/uL (ref 4.0–10.5)

## 2020-02-07 LAB — LIPID PANEL
Cholesterol: 167 mg/dL (ref 0–200)
HDL: 44.9 mg/dL (ref >39.00)
LDL Cholesterol: 96 mg/dL (ref 0–99)
NonHDL: 121.71
Total CHOL/HDL Ratio: 4
Triglycerides: 128 mg/dL (ref 0.0–149.0)
VLDL: 25.6 mg/dL (ref 0.0–40.0)

## 2020-02-07 LAB — HEMOGLOBIN A1C: Hgb A1c MFr Bld: 5.2 % (ref 4.6–6.5)

## 2020-02-07 LAB — PSA: PSA: 0.38 ng/mL (ref 0.10–4.00)

## 2020-02-07 NOTE — Telephone Encounter (Signed)
Before makign change- team please make sure he is on amlodipine 10 mg before bed. Labetalol 100mg  twice daily, losartan 100mg  before bed and chlorthalidone 25 mg in the morning.   If he is on the above and still high can stop losartan and chlorthalidone and restart losartan-hctz 100-25mg .

## 2020-02-07 NOTE — Telephone Encounter (Signed)
Left message to return call to our office.  

## 2020-02-07 NOTE — Telephone Encounter (Signed)
Patient came in to the office and says his blood pressure medication was changed to which has been making it run 154/98 and would like to know if he needs to go back to the old medicine.

## 2020-02-07 NOTE — Telephone Encounter (Signed)
See below

## 2020-02-08 NOTE — Telephone Encounter (Signed)
Patient returned call He is on all medications currently has been given instructions on how to change. Repeated back to me. Will call if any questions.

## 2020-03-07 ENCOUNTER — Encounter: Payer: Self-pay | Admitting: Family Medicine
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
## 2020-03-26 ENCOUNTER — Encounter: Payer: Self-pay | Admitting: Family Medicine

## 2020-04-18 ENCOUNTER — Encounter: Payer: Self-pay | Admitting: Family Medicine

## 2020-04-19 ENCOUNTER — Other Ambulatory Visit: Payer: Self-pay | Admitting: Family Medicine

## 2020-04-30 ENCOUNTER — Other Ambulatory Visit: Payer: Self-pay

## 2020-04-30 MED ORDER — CHLORTHALIDONE 25 MG PO TABS: 25.0000 mg | ORAL_TABLET | Freq: Every day | ORAL | 3 refills | Status: DC

## 2020-05-03 NOTE — Patient Instructions (Addendum)
Health Maintenance Due  Topic Date Due  . INFLUENZA VACCINE In office flu shot   Final shingrix today 03/03/2020    above ideal goal for history or aneurysm thoracic- will increase labetalol to 200mg  twice daily and ask him to update me in 2-3 weeks with how #s are doing. Prefer 105-120 systlic but definitely want all below 130 at least  I know work is hard but would be great to do some walking 30 minutes a day for exercise  Sign release of information at the check out desk for colonoscopy from [place]. I can see the day you had it but not the formal report.    DASH Eating Plan DASH stands for "Dietary Approaches to Stop Hypertension." The DASH eating plan is a healthy eating plan that has been shown to reduce high blood pressure (hypertension). It may also reduce your risk for type 2 diabetes, heart disease, and stroke. The DASH eating plan may also help with weight loss. What are tips for following this plan?  General guidelines  Avoid eating more than 2,300 mg (milligrams) of salt (sodium) a day. If you have hypertension, you may need to reduce your sodium intake to 1,500 mg a day.  Limit alcohol intake to no more than 1 drink a day for nonpregnant women and 2 drinks a day for men. One drink equals 12 oz of beer, 5 oz of wine, or 1 oz of hard liquor.  Work with your health care provider to maintain a healthy body weight or to lose weight. Ask what an ideal weight is for you.  Get at least 30 minutes of exercise that causes your heart to beat faster (aerobic exercise) most days of the week. Activities may include walking, swimming, or biking.  Work with your health care provider or diet and nutrition specialist (dietitian) to adjust your eating plan to your individual calorie needs. Reading food labels   Check food labels for the amount of sodium per serving. Choose foods with less than 5 percent of the Daily Value of sodium. Generally, foods with less than 300 mg of sodium  per serving fit into this eating plan.  To find whole grains, look for the word "whole" as the first word in the ingredient list. Shopping  Buy products labeled as "low-sodium" or "no salt added."  Buy fresh foods. Avoid canned foods and premade or frozen meals. Cooking  Avoid adding salt when cooking. Use salt-free seasonings or herbs instead of table salt or sea salt. Check with your health care provider or pharmacist before using salt substitutes.  Do not fry foods. Cook foods using healthy methods such as baking, boiling, grilling, and broiling instead.  Cook with heart-healthy oils, such as olive, canola, soybean, or sunflower oil. Meal planning  Eat a balanced diet that includes: ? 5 or more servings of fruits and vegetables each day. At each meal, try to fill half of your plate with fruits and vegetables. ? Up to 6-8 servings of whole grains each day. ? Less than 6 oz of lean meat, poultry, or fish each day. A 3-oz serving of meat is about the same size as a deck of cards. One egg equals 1 oz. ? 2 servings of low-fat dairy each day. ? A serving of nuts, seeds, or beans 5 times each week. ? Heart-healthy fats. Healthy fats called Omega-3 fatty acids are found in foods such as flaxseeds and coldwater fish, like sardines, salmon, and mackerel.  Limit how much you eat  of the following: ? Canned or prepackaged foods. ? Food that is high in trans fat, such as fried foods. ? Food that is high in saturated fat, such as fatty meat. ? Sweets, desserts, sugary drinks, and other foods with added sugar. ? Full-fat dairy products.  Do not salt foods before eating.  Try to eat at least 2 vegetarian meals each week.  Eat more home-cooked food and less restaurant, buffet, and fast food.  When eating at a restaurant, ask that your food be prepared with less salt or no salt, if possible. What foods are recommended? The items listed may not be a complete list. Talk with your dietitian  about what dietary choices are best for you. Grains Whole-grain or whole-wheat bread. Whole-grain or whole-wheat pasta. Brown rice. Orpah Cobb. Bulgur. Whole-grain and low-sodium cereals. Pita bread. Low-fat, low-sodium crackers. Whole-wheat flour tortillas. Vegetables Fresh or frozen vegetables (raw, steamed, roasted, or grilled). Low-sodium or reduced-sodium tomato and vegetable juice. Low-sodium or reduced-sodium tomato sauce and tomato paste. Low-sodium or reduced-sodium canned vegetables. Fruits All fresh, dried, or frozen fruit. Canned fruit in natural juice (without added sugar). Meat and other protein foods Skinless chicken or [place]. Ground chicken or [place]. Pork with fat trimmed off. Fish and seafood. Egg whites. Dried beans, peas, or lentils. Unsalted nuts, nut butters, and seeds. Unsalted canned beans. Lean cuts of beef with fat trimmed off. Low-sodium, lean deli meat. Dairy Low-fat (1%) or fat-free (skim) milk. Fat-free, low-fat, or reduced-fat cheeses. Nonfat, low-sodium ricotta or cottage cheese. Low-fat or nonfat yogurt. Low-fat, low-sodium cheese. Fats and oils Soft margarine without trans fats. Vegetable oil. Low-fat, reduced-fat, or light mayonnaise and salad dressings (reduced-sodium). Canola, safflower, olive, soybean, and sunflower oils. Avocado. Seasoning and other foods Herbs. Spices. Seasoning mixes without salt. Unsalted popcorn and pretzels. Fat-free sweets. What foods are not recommended? The items listed may not be a complete list. Talk with your dietitian about what dietary choices are best for you. Grains Baked goods made with fat, such as croissants, muffins, or some breads. Dry pasta or rice meal packs. Vegetables Creamed or fried vegetables. Vegetables in a cheese sauce. Regular canned vegetables (not low-sodium or reduced-sodium). Regular canned tomato sauce and paste (not low-sodium or reduced-sodium). Regular tomato and vegetable juice (not low-sodium or  reduced-sodium). Rosita Fire. Olives. Fruits Canned fruit in a light or heavy syrup. Fried fruit. Fruit in cream or butter sauce. Meat and other protein foods Fatty cuts of meat. Ribs. Fried meat. Tomasa Blase. Sausage. Bologna and other processed lunch meats. Salami. Fatback. Hotdogs. Bratwurst. Salted nuts and seeds. Canned beans with added salt. Canned or smoked fish. Whole eggs or egg yolks. Chicken or [place] with skin. Dairy Whole or 2% milk, cream, and half-and-half. Whole or full-fat cream cheese. Whole-fat or sweetened yogurt. Full-fat cheese. Nondairy creamers. Whipped toppings. Processed cheese and cheese spreads. Fats and oils Butter. Stick margarine. Lard. Shortening. Ghee. Bacon fat. Tropical oils, such as coconut, palm kernel, or palm oil. Seasoning and other foods Salted popcorn and pretzels. Onion salt, garlic salt, seasoned salt, table salt, and sea salt. Worcestershire sauce. Tartar sauce. Barbecue sauce. Teriyaki sauce. Soy sauce, including reduced-sodium. Steak sauce. Canned and packaged gravies. Fish sauce. Oyster sauce. Cocktail sauce. Horseradish that you find on the shelf. Ketchup. Mustard. Meat flavorings and tenderizers. Bouillon cubes. Hot sauce and Tabasco sauce. Premade or packaged marinades. Premade or packaged taco seasonings. Relishes. Regular salad dressings. Where to find more information:  National Heart, Lung, and Blood Institute: PopSteam.is  American Heart  Association: www.heart.org Summary  The DASH eating plan is a healthy eating plan that has been shown to reduce high blood pressure (hypertension). It may also reduce your risk for type 2 diabetes, heart disease, and stroke.  With the DASH eating plan, you should limit salt (sodium) intake to 2,300 mg a day. If you have hypertension, you may need to reduce your sodium intake to 1,500 mg a day.  When on the DASH eating plan, aim to eat more fresh fruits and vegetables, whole grains, lean proteins, low-fat  dairy, and heart-healthy fats.  Work with your health care provider or diet and nutrition specialist (dietitian) to adjust your eating plan to your individual calorie needs. This information is not intended to replace advice given to you by your health care provider. Make sure you discuss any questions you have with your health care provider. Document Revised: 07/02/2017 Document Reviewed: 07/13/2016 Elsevier Patient Education  [employer].  Please stop by lab before you go If you have mychart- we will send your results within 3 business days of [place] receiving them.  If you do not have mychart- we will call you about results within 5 business days of [place] receiving them.  *please note we are currently using [facility] which has a longer processing time than [place] typically so labs may not come back as quickly as in the past *please also note that you will see labs on mychart as soon as they post. I will later go in and write notes on them- will say "notes from Dr. Durene Cal"  Stop by desk and cancel January physical- schedule 6 month blood pressure follow up from today- see me sooner if blood pressure not trending down.
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
## 2020-05-06 ENCOUNTER — Encounter: Payer: Self-pay | Admitting: Family Medicine

## 2020-05-06 ENCOUNTER — Other Ambulatory Visit: Payer: Self-pay

## 2020-05-06 ENCOUNTER — Ambulatory Visit (INDEPENDENT_AMBULATORY_CARE_PROVIDER_SITE_OTHER): Payer: Managed Care, Other (non HMO) | Admitting: Family Medicine

## 2020-05-06 VITALS — BP 132/80 | HR 77 | Temp 98.3°F | Resp 18 | Ht 72.0 in | Wt 266.8 lb

## 2020-05-06 DIAGNOSIS — Z23 Encounter for immunization: Secondary | ICD-10-CM

## 2020-05-06 DIAGNOSIS — I10 Essential (primary) hypertension: Secondary | ICD-10-CM | POA: Diagnosis not present

## 2020-05-06 DIAGNOSIS — Z0000 Encounter for general adult medical examination without abnormal findings: Secondary | ICD-10-CM | POA: Diagnosis not present

## 2020-05-06 DIAGNOSIS — I712 Thoracic aortic aneurysm, without rupture, unspecified: Secondary | ICD-10-CM

## 2020-05-06 DIAGNOSIS — R569 Unspecified convulsions: Secondary | ICD-10-CM | POA: Diagnosis not present

## 2020-05-06 DIAGNOSIS — E785 Hyperlipidemia, unspecified: Secondary | ICD-10-CM

## 2020-05-06 DIAGNOSIS — Z79899 Other long term (current) drug therapy: Secondary | ICD-10-CM

## 2020-05-06 MED ORDER — LABETALOL HCL 200 MG PO TABS
200.0000 mg | ORAL_TABLET | Freq: Two times a day (BID) | ORAL | 3 refills | Status: DC
Start: 2020-05-06 — End: 2020-06-20

## 2020-05-06 NOTE — Assessment & Plan Note (Signed)
S: medication: amlodipine 10mg  , labetalol 100mg  twice daily, losartan 100mg , chlorthalidone 25 mg Home readings #s: mainly 120s or 130s BP Readings from Last 3 Encounters:  05/06/20 132/80  01/31/20 128/82  08/04/18 133/85  A/P: above ideal goal for history or aneurysm thoracic- will increase labetalol to 200mg  twice daily and ask him to update me in 2-3 weeks with how #s are doing. Prefer 105-120 systlic but definitely want all below 130 at least

## 2020-05-06 NOTE — Addendum Note (Signed)
Addended by: Manuela Schwartz on: 05/06/2020 10:16 AM   Modules accepted: Orders

## 2020-05-06 NOTE — Progress Notes (Signed)
Phone [phone] In person visit   Subjective:   Jonathon Patel is a 64 y.o. year old very pleasant male patient who presents for/with See problem oriented charting  This visit occurred during the SARS-CoV-2 public health emergency.  Safety protocols were in place, including screening questions prior to the visit, additional usage of staff PPE, and extensive cleaning of exam room while observing appropriate contact time as indicated for disinfecting solutions.   Past Medical History-  Patient Active Problem List   Diagnosis Date Noted  . Thoracic aortic aneurysm (HCC) 01/31/2020    Priority: High  . History of colon polyps 02/12/2018    Priority: High  . Seizures (HCC)     Priority: High  . Osteoarthritis, shoulder 08/04/2018    Priority: Medium  . Rosacea 05/09/2018    Priority: Medium  . Hyperlipidemia 02/12/2018    Priority: Medium  . Hyperglycemia, unspecified 02/12/2018    Priority: Medium  . Hypertension     Priority: Medium  . 1st degree AV block 02/12/2018    Priority: Low  . Full code status 02/12/2018    Priority: Low    Medications- reviewed and updated Current Outpatient Medications  Medication Sig Dispense Refill  . amLODipine (NORVASC) 10 MG tablet Take 1 tablet (10 mg total) by mouth daily. 90 tablet 3  . betamethasone dipropionate 0.05 % cream betamethasone dipropionate 0.05 % topical cream  APP EXT AA BID PRF FLARES    . chlorthalidone (HYGROTON) 25 MG tablet Take 1 tablet (25 mg total) by mouth daily. 90 tablet 3  . labetalol (NORMODYNE) 200 MG tablet Take 1 tablet (200 mg total) by mouth 2 (two) times daily. NO PRINT 180 tablet 3  . lamoTRIgine (LAMICTAL) 150 MG tablet Take 2 tablets (300 mg total) by mouth daily. 180 tablet 3  . omeprazole (PRILOSEC) 20 MG capsule TAKE 1 CAPSULE(20 MG) BY MOUTH DAILY 90 capsule 3  . pimecrolimus (ELIDEL) 1 % cream Apply topically.    . SOOLANTRA 1 % CREA Apply topically every other day.     No current  facility-administered medications for this visit.     Objective:  BP 132/80   Pulse 77   Temp 98.3 F (36.8 C) (Temporal)   Resp 18   Ht 6' (1.829 m)   Wt 266 lb 12.8 oz (121 kg)   SpO2 95%   BMI 36.18 kg/m  Gen: NAD, resting comfortably     Assessment and Plan   Hypertension S: medication: amlodipine 10mg  , labetalol 100mg  twice daily, losartan 100mg , chlorthalidone 25 mg Home readings #s: mainly 120s or 130s BP Readings from Last 3 Encounters:  05/06/20 132/80  01/31/20 128/82  08/04/18 133/85  A/P: above ideal goal for history or aneurysm thoracic- will increase labetalol to 200mg  twice daily and ask him to update me in 2-3 weeks with how #s are doing. Prefer 105-120 systlic but definitely want all below 130 at least  Recommended follow up: We will cancel January physical-reschedule 74-month blood pressure check-happy to see him sooner if blood pressure not coming down with labetalol change Future Appointments  Date Time Provider Department Center  08/09/2020  2:40 PM , MD [facility]    Lab/Order associations:   ICD-10-CM   2. Primary hypertension  I10 Basic metabolic panel    Meds ordered this encounter  Medications  . labetalol (NORMODYNE) 200 MG tablet    Sig: Take 1 tablet (200 mg total) by mouth 2 (two) times daily. NO PRINT  Dispense:  180 tablet    Refill:  3   Return precautions advised.  Garret Reddish, MD

## 2020-05-10 ENCOUNTER — Encounter: Payer: Self-pay | Admitting: Family Medicine

## 2020-06-07 ENCOUNTER — Ambulatory Visit (INDEPENDENT_AMBULATORY_CARE_PROVIDER_SITE_OTHER)
Admission: RE | Admit: 2020-06-07 | Discharge: 2020-06-07 | Disposition: A | Discharge status: Home / Self Care | Payer: Self-pay | Source: Ambulatory Visit | Attending: Family Medicine | Admitting: Family Medicine

## 2020-06-07 ENCOUNTER — Other Ambulatory Visit: Payer: Self-pay

## 2020-06-07 ENCOUNTER — Encounter: Payer: Self-pay | Admitting: Family Medicine

## 2020-06-07 DIAGNOSIS — E785 Hyperlipidemia, unspecified: Secondary | ICD-10-CM

## 2020-06-07 DIAGNOSIS — I700 Atherosclerosis of aorta: Secondary | ICD-10-CM | POA: Insufficient documentation

## 2020-06-07 IMAGING — CT CT CARDIAC CORONARY ARTERY CALCIUM SCORE
3 series · 14 of 20 positions shown, 15 images · non-contrast
Comparison: None.
COMPARISON: None.

Addendum:
EXAM:
OVER-READ INTERPRETATION  CT CHEST

The following report is an over-read performed by radiologist Dr.
BUJANGE [REDACTED] on [DATE]. This
over-read does not include interpretation of cardiac or coronary
anatomy or pathology. The coronary calcium score interpretation by
the cardiologist is attached.
CLINICAL DATA: Risk stratification
Coronary Calcium Score
TECHNIQUE: The patient was scanned on a Siemens Force scanner. Axial
non-contrast 3 mm slices were carried out through the heart. The
data set was analyzed on a dedicated work station and scored using
the Agatson method.

[Series 2: casc 3.0 bv41 2 bestdiast 71 % · axial · 0.38mm/px · z∈[-261,-204]mm · 4 of 33 slices shown, 5 images]
[im 7/33  vessel]
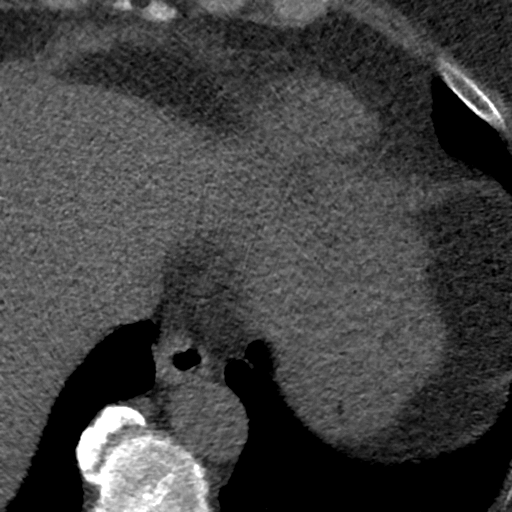
[im 7/33  lung]
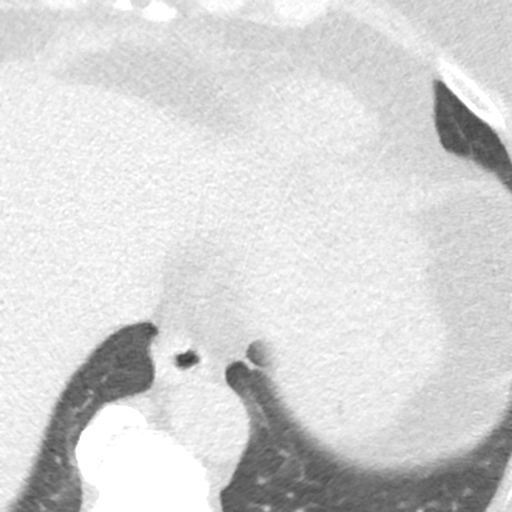
[im 13/33  vessel]
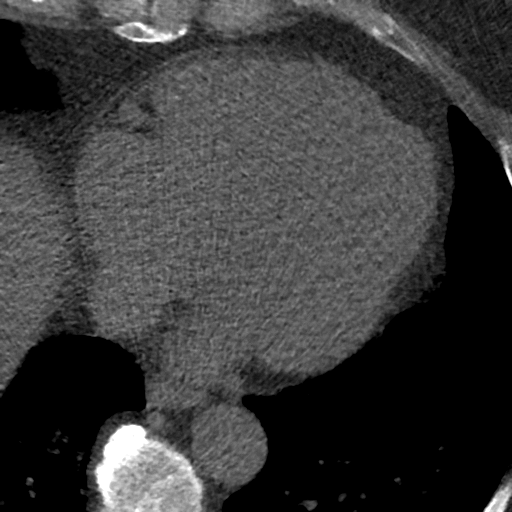
[im 20/33  vessel]
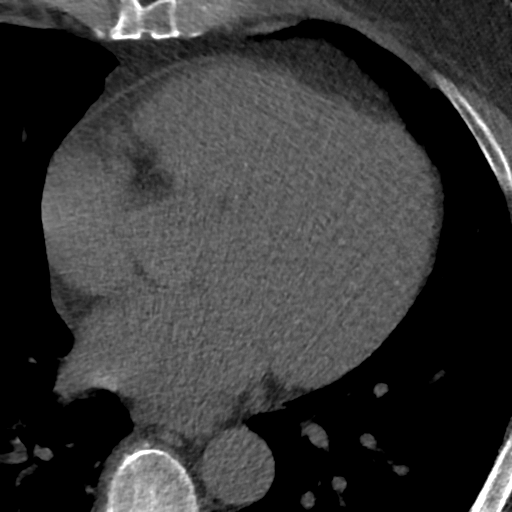
[im 26/33  vessel]
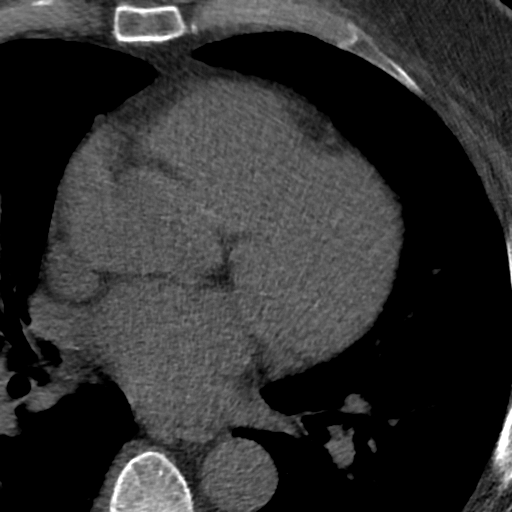

[Series 3: lung 74 % · axial · 0.71mm/px · z∈[-262,-200]mm · 5 of 33 slices shown]
[im 6/33  lung]
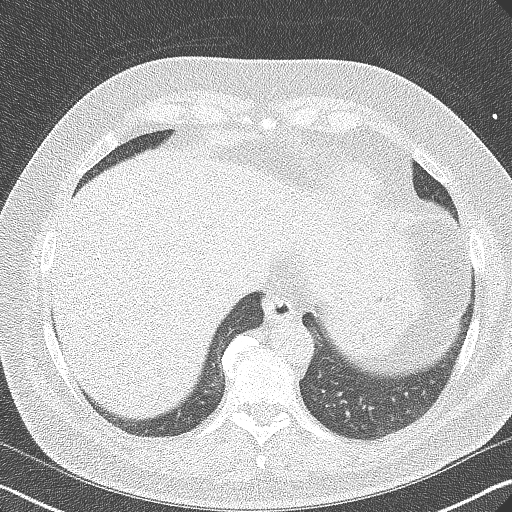
[im 11/33  lung]
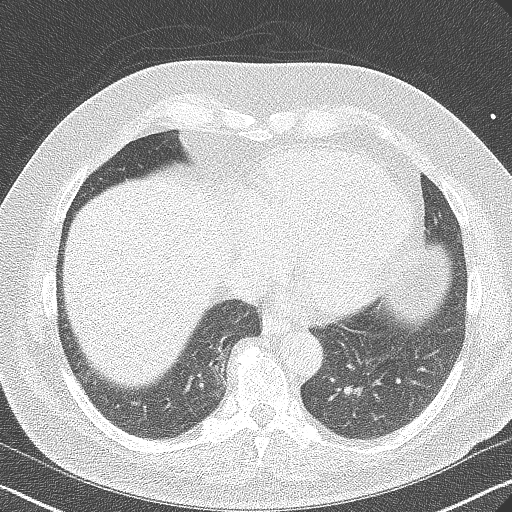
[im 17/33  lung]
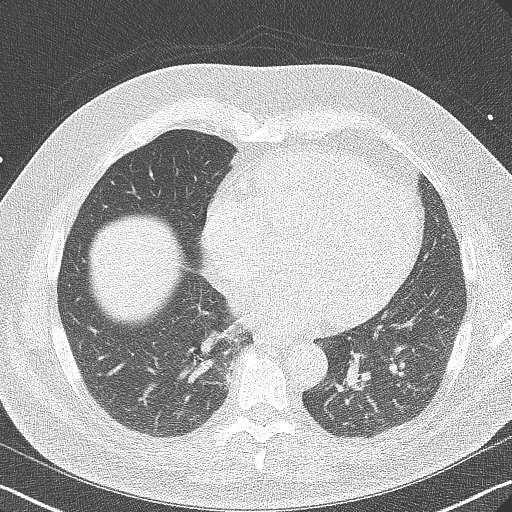
[im 22/33  lung]
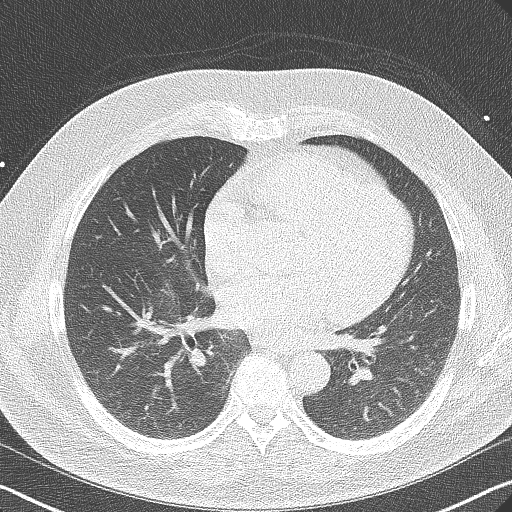
[im 27/33  lung]
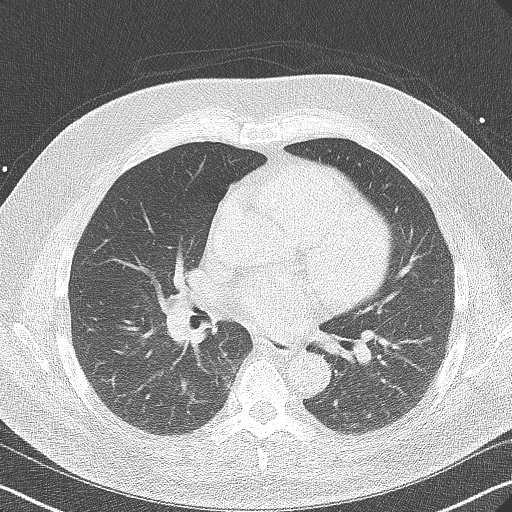

[Series 4: lung st 74 % · axial · 0.71mm/px · z∈[-262,-200]mm · 5 of 33 slices shown]
[im 6/33  lung]
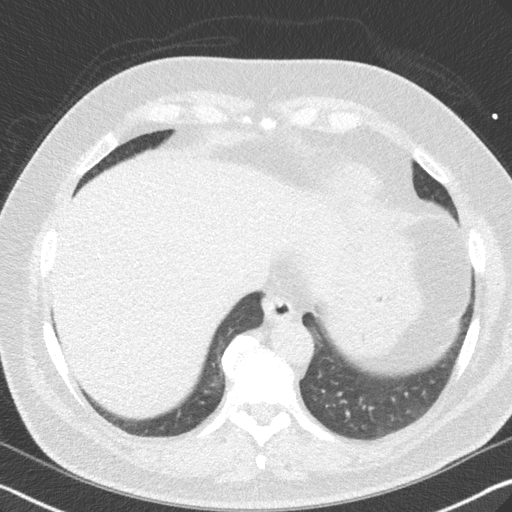
[im 11/33  lung]
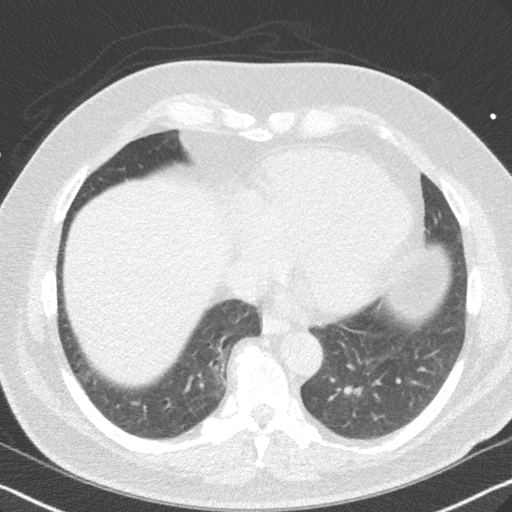
[im 17/33  lung]
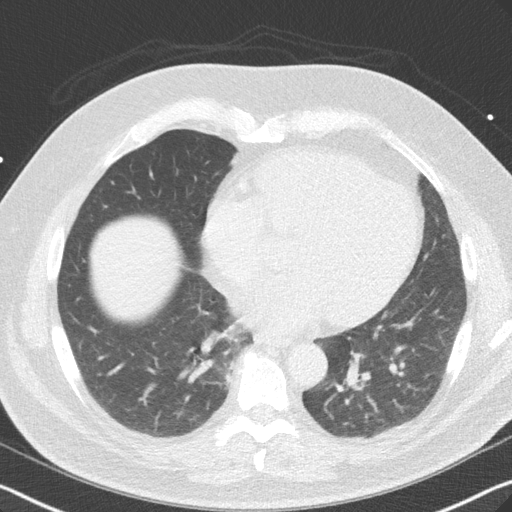
[im 22/33  lung]
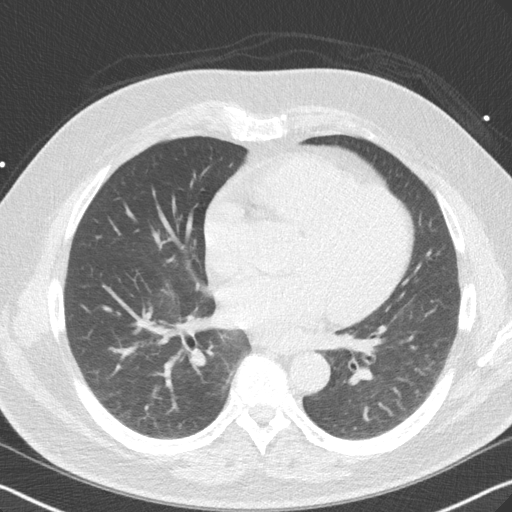
[im 27/33  lung]
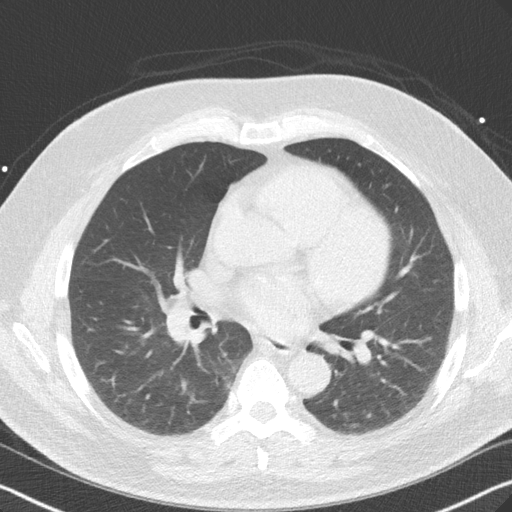

[14 of 20 positions shown; findings below may reference images not displayed]

FINDINGS: Aortic atherosclerosis. Within the visualized portions of the thorax
there are no suspicious appearing pulmonary nodules or masses, there
is no acute consolidative airspace disease, no pleural effusions, no
pneumothorax and no lymphadenopathy. Visualized portions of the
upper abdomen are unremarkable. There are no aggressive appearing
lytic or blastic lesions noted in the visualized portions of the
skeleton.
IMPRESSION: 1.  Aortic Atherosclerosis ([FZ]-[FZ]).
FINDINGS: Non-cardiac: See separate report from [REDACTED].

Ascending Aorta: There is moderate ascending aortic aneurysm with
maximum diameter 47 mm in the axial views. Minimal calcifications in
the thoracic aorta.

Pericardium: Normal.

Coronary arteries: Normal origin.
IMPRESSION: 1. Coronary calcium score of 0. This was 0 percentile for age and
sex matched control.

2. There is moderate ascending aortic aneurysm with maximum diameter
47 mm in the axial views. A gated chest CTA is recommended for
further evaluation.

*** End of Addendum ***
EXAM:
OVER-READ INTERPRETATION  CT CHEST

The following report is an over-read performed by radiologist Dr.
BUJANGE [REDACTED] on [DATE]. This
over-read does not include interpretation of cardiac or coronary
anatomy or pathology. The coronary calcium score interpretation by
the cardiologist is attached.
FINDINGS: Aortic atherosclerosis. Within the visualized portions of the thorax
there are no suspicious appearing pulmonary nodules or masses, there
is no acute consolidative airspace disease, no pleural effusions, no
pneumothorax and no lymphadenopathy. Visualized portions of the
upper abdomen are unremarkable. There are no aggressive appearing
lytic or blastic lesions noted in the visualized portions of the
skeleton.
IMPRESSION: 1.  Aortic Atherosclerosis ([FZ]-[FZ]).

## 2020-06-14 DIAGNOSIS — M19011 Primary osteoarthritis, right shoulder: Secondary | ICD-10-CM | POA: Diagnosis not present

## 2020-06-14 DIAGNOSIS — M19012 Primary osteoarthritis, left shoulder: Secondary | ICD-10-CM | POA: Diagnosis not present

## 2020-06-20 ENCOUNTER — Encounter: Payer: Self-pay | Admitting: Family Medicine

## 2020-06-20 ENCOUNTER — Other Ambulatory Visit: Payer: Self-pay

## 2020-06-20 MED ORDER — LABETALOL HCL 200 MG PO TABS
200.0000 mg | ORAL_TABLET | Freq: Two times a day (BID) | ORAL | 3 refills | Status: DC
Start: 2020-06-20 — End: 2020-07-02

## 2020-06-20 MED ORDER — LAMOTRIGINE 150 MG PO TABS: 300.0000 mg | ORAL_TABLET | Freq: Every day | ORAL | 3 refills | Status: AC

## 2020-06-20 MED ORDER — BETAMETHASONE DIPROPIONATE 0.05 % EX CREA: TOPICAL_CREAM | CUTANEOUS | 3 refills | Status: AC

## 2020-06-20 MED ORDER — OMEPRAZOLE 20 MG PO CPDR: DELAYED_RELEASE_CAPSULE | ORAL | 3 refills | Status: AC

## 2020-06-20 MED ORDER — CHLORTHALIDONE 25 MG PO TABS: 25.0000 mg | ORAL_TABLET | Freq: Every day | ORAL | 3 refills | Status: AC

## 2020-06-20 MED ORDER — AMLODIPINE BESYLATE 10 MG PO TABS
10.0000 mg | ORAL_TABLET | Freq: Every day | ORAL | 3 refills | Status: DC
Start: 2020-06-20 — End: 2020-08-09

## 2020-06-30 ENCOUNTER — Other Ambulatory Visit: Payer: Self-pay | Admitting: Family Medicine

## 2020-07-01 ENCOUNTER — Other Ambulatory Visit: Payer: Self-pay

## 2020-07-01 MED ORDER — IVERMECTIN 1 % EX CREA: 1.0000 | TOPICAL_CREAM | Freq: Every day | CUTANEOUS | 2 refills | Status: AC

## 2020-07-01 MED ORDER — LOSARTAN POTASSIUM 100 MG PO TABS: 100.0000 mg | ORAL_TABLET | Freq: Every day | ORAL | 3 refills | Status: DC

## 2020-07-02 ENCOUNTER — Other Ambulatory Visit: Payer: Self-pay

## 2020-07-02 MED ORDER — LABETALOL HCL 200 MG PO TABS: 200.0000 mg | ORAL_TABLET | Freq: Two times a day (BID) | ORAL | 3 refills | Status: AC

## 2020-07-12 DIAGNOSIS — R410 Disorientation, unspecified: Secondary | ICD-10-CM | POA: Diagnosis not present

## 2020-07-12 DIAGNOSIS — R55 Syncope and collapse: Secondary | ICD-10-CM | POA: Diagnosis not present

## 2020-07-12 DIAGNOSIS — R4020 Unspecified coma: Secondary | ICD-10-CM | POA: Diagnosis not present

## 2020-07-12 DIAGNOSIS — I440 Atrioventricular block, first degree: Secondary | ICD-10-CM | POA: Diagnosis not present

## 2020-08-08 NOTE — Patient Instructions (Addendum)
Please stop by lab before you go- team please release labs from October- lamictal level and BMP If you have mychart- we will send your results within 3 business days of [place] receiving them.  If you do not have mychart- we will call you about results within 5 business days of [place] receiving them.  *please also note that you will see labs on mychart as soon as they post. I will later go in and write notes on them- will say "notes from Dr. Durene Cal"  Referral info We will call you within two weeks about your referral to CT angiogram to follow up aneurysm and ultrasound of kidneys. If you do not hear within 2 weeks, give [place] a call.   We will call you within two weeks about your referral to resistant hypertension clinic. If you do not hear within 2 weeks, give [place] a call. Also please let me know if it is going to take more than 1-2 months to get in.    For blood pressure STOP amlodipine 10 mg and losartan 100 mg once you get the new medicine [facility] -Start taking new medicine amlodipine-valsartan 10-320 mg before bed -continue labetalol 200mg  twice daily -continue chlorthalidone 25mg  -update me 1 week after you make the change with how blood pressure is doing - I will likely add spironolactone if blood pressure not improving if you do not already have a visit set up with Dr. - cardiologist that specializes in resistant hypertension  If cryotherapy does not clear up area on the arm- im going to want you to see dermatology - see handout   Recommended follow up: 1 month follow up

## 2020-08-08 NOTE — Progress Notes (Signed)
Phone [phone] In person visit   Subjective:   Jonathon Patel is a 65 y.o. year old very pleasant male patient who presents for/with See problem oriented charting Chief Complaint  Patient presents with  . Aneurysm   This visit occurred during the SARS-CoV-2 public health emergency.  Safety protocols were in place, including screening questions prior to the visit, additional usage of staff PPE, and extensive cleaning of exam room while observing appropriate contact time as indicated for disinfecting solutions.   Past Medical History-  Patient Active Problem List   Diagnosis Date Noted  . Thoracic aortic aneurysm (HCC) 01/31/2020    Priority: High  . History of colon polyps 02/12/2018    Priority: High  . Seizures (HCC)     Priority: High  . Osteoarthritis, shoulder 08/04/2018    Priority: Medium  . Rosacea 05/09/2018    Priority: Medium  . Hyperlipidemia 02/12/2018    Priority: Medium  . Hyperglycemia, unspecified 02/12/2018    Priority: Medium  . Hypertension     Priority: Medium  . 1st degree AV block 02/12/2018    Priority: Low  . Full code status 02/12/2018    Priority: Low  . Aortic atherosclerosis (HCC) 06/07/2020    Medications- reviewed and updated Current Outpatient Medications  Medication Sig Dispense Refill  . amLODipine-valsartan (EXFORGE) 10-320 MG tablet Take 1 tablet by mouth daily. 90 tablet 3  . betamethasone dipropionate 0.05 % cream betamethasone dipropionate 0.05 % topical cream  APP EXT AA BID PRF FLARES 30 g 3  . chlorthalidone (HYGROTON) 25 MG tablet Take 1 tablet (25 mg total) by mouth daily. 90 tablet 3  . Ivermectin 1 % CREA Apply 1 Tube topically daily. 45 g 2  . labetalol (NORMODYNE) 200 MG tablet Take 1 tablet (200 mg total) by mouth 2 (two) times daily. NO PRINT 180 tablet 3  . lamoTRIgine (LAMICTAL) 150 MG tablet Take 2 tablets (300 mg total) by mouth daily. 180 tablet 3  . omeprazole (PRILOSEC) 20 MG capsule TAKE 1 CAPSULE(20 MG) BY  MOUTH DAILY 90 capsule 3  . SOOLANTRA 1 % CREA Apply topically every other day.    . pimecrolimus (ELIDEL) 1 % cream Apply topically.     No current facility-administered medications for this visit.     Objective:  BP (!) 140/92   Pulse 67   Temp 98 F (36.7 C) (Temporal)   Ht 6' (1.829 m)   Wt 263 lb 9.6 oz (119.6 kg)   SpO2 96%   BMI 35.75 kg/m  Gen: NAD, resting comfortably CV: RRR no murmurs rubs or gallops Lungs: CTAB no crackles, wheeze, rhonchi Abdomen: soft/nontender/nondistended/normal bowel sounds. No rebound or guarding.  Ext: no edema Skin: warm, dry, 0.5 x 0.5 erythematous area with fine scaline on top on right forearm, 2 mm lesion distal to this with similar appearance  Procedure note: Pre procedure diagnosis actinic keratosis  Post procedure diagnosis actinic keratosis  benefits and risks verbally discussed with patient 10 second freeze thaw cycle of cryotherapy performed  with liquid nitrogen to right forearm 0.5 x 0.5 cm erythematous area-10-second cycle was also completed on 2 mm lesion distal to this No complications.  Patient tolerated the procedure well other than mild pain. Gave handout on this from [facility] and we reviewed this content thoroughly [email or web address]     Assessment and Plan   #Thoracic aortic aneurysm S:This was detected January 26, 2020--incidentally noted on CT of the shoulder at 4.3 cm-they recommended annual imaging by  CT angiogram or MR angiogram.    Patient then had CT cardiac scoring and was noted to have moderate ascending aortic aneurysm with maximum diameter of 4.7 cm-I recommended a gated chest CT angiogram  Patient is now due for formal 51-month follow-up A/P: I am not sure of the accuracy of CT shoulder and CT cardiac scoring for adequately assessing thoracic aortic aneurysm-we are going to do CT angiogram of the aorta to fully assess -Regardless we really need  patient's blood pressure to be 105-120 range systolic-I was also patient I was somewhat discouraged high blood pressure was running higher instead of lower-see discussion below  #hypertension/resistant hypertension S: medication: Amlodipine 10 mg, labetalol 200 mg twice daily, losartan 100 mg, chlorthalidone 25 mg  -Strongly prefer blood pressure 105-120 range due to aneurysm as above Home readings #s: 130s/90s  Exercise 1-2x a week. Trying to limit salt- down 3 lbs from last visit and 7 lbs from June visit. 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 Refer to advanced hypertension clinic. We will get renal artery duplex to evaluate for renal artery stenosis-I suspect this is less likely due to patient tolerating ARB  STOP amlodipine 10 mg and losartan 100 mg once you get the new medicine [facility] -Start taking new medicine amlodipine-valsartan 10-320 mg before bed -continue labetalol 200mg  twice daily -continue chlorthalidone 25mg  -update me 1 week after you make the change with how blood pressure is doing - I will likely add spironolactone if blood pressure not improving if you do not already have a visit set up with Dr. - cardiologist that specializes in resistant hypertension  #Actinic keratosis S: red raised lesion with some scaling on right arm- does not seem to heal- has had some color change A/P: Appears actinic keratosis-we treated with 10-second round of cryotherapy. If this does not resolves- should discuss with dermatologist at visit in march.   #Seizure history we opted to check Lamictal level with labs. No evidence of recurrent seizures  Recommended follow up: Return in about 1 month (around 09/09/2020) for follow up- or sooner if needed.  Lab/Order associations:    ICD-10-CM   1. Thoracic aortic aneurysm without rupture (HCC)  I71.2 CT ANGIO CHEST AORTA W/CM & OR WO/CM    CANCELED: CT ANGIO CHEST AORTA W/CM & OR WO/CM    CANCELED: CT ANGIO CHEST AORTA W/CM & OR WO/CM  2. Resistant hypertension  I10 VAS April RENAL ARTERY DUPLEX    Ambulatory referral to [facility] [address]    CANCELED: VAS 11/07/2020 RENAL ARTERY DUPLEX    CANCELED: Ambulatory referral to [facility] [address]  3. Primary hypertension  I10 Basic metabolic panel  4. Hyperglycemia, unspecified  R73.9   5. Hyperlipidemia, unspecified hyperlipidemia type  E78.5   6. Seizures (HCC)  R56.9 Lamotrigine level  7. High risk medication use  Z79.899 Lamotrigine level    Meds ordered this encounter  Medications  . amLODipine-valsartan (EXFORGE) 10-320 MG tablet    Sig: Take 1 tablet by mouth daily.    Dispense:  90 tablet    Refill:  3   Return precautions advised.  [place], MD

## 2020-08-09 ENCOUNTER — Encounter: Payer: Self-pay | Admitting: Family Medicine

## 2020-08-09 ENCOUNTER — Ambulatory Visit (INDEPENDENT_AMBULATORY_CARE_PROVIDER_SITE_OTHER): Payer: BC Managed Care – PPO | Admitting: Family Medicine

## 2020-08-09 ENCOUNTER — Other Ambulatory Visit: Payer: Self-pay

## 2020-08-09 VITALS — BP 140/92 | HR 67 | Temp 98.0°F | Ht 72.0 in | Wt 263.6 lb

## 2020-08-09 DIAGNOSIS — I712 Thoracic aortic aneurysm, without rupture, unspecified: Secondary | ICD-10-CM

## 2020-08-09 DIAGNOSIS — Z0000 Encounter for general adult medical examination without abnormal findings: Secondary | ICD-10-CM

## 2020-08-09 DIAGNOSIS — R739 Hyperglycemia, unspecified: Secondary | ICD-10-CM

## 2020-08-09 DIAGNOSIS — L570 Actinic keratosis: Secondary | ICD-10-CM

## 2020-08-09 DIAGNOSIS — Z79899 Other long term (current) drug therapy: Secondary | ICD-10-CM

## 2020-08-09 DIAGNOSIS — E785 Hyperlipidemia, unspecified: Secondary | ICD-10-CM

## 2020-08-09 DIAGNOSIS — R569 Unspecified convulsions: Secondary | ICD-10-CM | POA: Diagnosis not present

## 2020-08-09 DIAGNOSIS — I10 Essential (primary) hypertension: Secondary | ICD-10-CM

## 2020-08-09 MED ORDER — AMLODIPINE BESYLATE-VALSARTAN 10-320 MG PO TABS: 1.0000 | ORAL_TABLET | Freq: Every day | ORAL | 3 refills | Status: AC

## 2020-08-13 LAB — BASIC METABOLIC PANEL
BUN: 21 mg/dL (ref 7–25)
CO2: 32 mmol/L (ref 20–32)
Calcium: 10 mg/dL (ref 8.6–10.3)
Chloride: 101 mmol/L (ref 98–110)
Creat: 1.15 mg/dL (ref 0.70–1.25)
Glucose, Bld: 85 mg/dL (ref 65–99)
Potassium: 3.5 mmol/L (ref 3.5–5.3)
Sodium: 140 mmol/L (ref 135–146)

## 2020-08-13 LAB — EXTRA LAV TOP TUBE

## 2020-08-13 LAB — LAMOTRIGINE LEVEL: Lamotrigine Lvl: 4.5 ug/mL (ref 4.0–18.0)

## 2020-08-16 ENCOUNTER — Encounter: Payer: Self-pay | Admitting: Family Medicine

## 2020-08-16 ENCOUNTER — Other Ambulatory Visit: Payer: Self-pay | Admitting: Family Medicine

## 2020-08-16 ENCOUNTER — Ambulatory Visit (HOSPITAL_COMMUNITY)
Admission: RE | Admit: 2020-08-16 | Discharge: 2020-08-16 | Disposition: A | Discharge status: Home / Self Care | Payer: BC Managed Care – PPO | Source: Ambulatory Visit | Attending: Family Medicine | Admitting: Family Medicine

## 2020-08-16 ENCOUNTER — Telehealth: Payer: Self-pay

## 2020-08-16 ENCOUNTER — Other Ambulatory Visit: Payer: Self-pay

## 2020-08-16 DIAGNOSIS — I10 Essential (primary) hypertension: Secondary | ICD-10-CM

## 2020-08-16 MED ORDER — CEPHALEXIN 500 MG PO CAPS: 500.0000 mg | ORAL_CAPSULE | Freq: Three times a day (TID) | ORAL | 0 refills | Status: DC

## 2020-08-16 MED FILL — CEPHALEXIN 500 MG CAPSULE: 500 | 7 days supply | Qty: 21 | Fill #0

## 2020-08-16 NOTE — Telephone Encounter (Signed)
MEDICATION: cephALEXin (KEFLEX) 500 MG capsule  PHARMACY:  [id number] - [place], [place] - [address] AT [facility] [address] [facility] [address] Phone:  [phone]  Fax:  [phone]       Comments: Patient would like the medication send here instead of [facility].    **Let patient know to contact pharmacy at the end of the day to make sure medication is ready. **  ** Please notify patient to allow 48-72 hours to process**  **Encourage patient to contact the pharmacy for refills or they can request refills through [facility]**

## 2020-08-16 NOTE — Telephone Encounter (Signed)
Medication has been sent to [facility]

## 2020-08-30 ENCOUNTER — Ambulatory Visit
Admission: RE | Admit: 2020-08-30 | Discharge: 2020-08-30 | Disposition: A | Discharge status: Home / Self Care | Payer: BC Managed Care – PPO | Source: Ambulatory Visit | Attending: Family Medicine | Admitting: Family Medicine

## 2020-08-30 DIAGNOSIS — I712 Thoracic aortic aneurysm, without rupture, unspecified: Secondary | ICD-10-CM

## 2020-08-30 DIAGNOSIS — I714 Abdominal aortic aneurysm, without rupture: Secondary | ICD-10-CM | POA: Diagnosis not present

## 2020-08-30 MED ORDER — IOPAMIDOL (ISOVUE-370) INJECTION 76%
75.0000 mL | Freq: Once | INTRAVENOUS | Status: AC | PRN
  Administered 2020-08-30: 75 mL via INTRAVENOUS

## 2020-09-01 ENCOUNTER — Encounter: Payer: Self-pay | Admitting: Family Medicine

## 2020-09-01 DIAGNOSIS — I10 Essential (primary) hypertension: Secondary | ICD-10-CM

## 2020-09-02 ENCOUNTER — Other Ambulatory Visit: Payer: Self-pay

## 2020-09-02 MED ORDER — SPIRONOLACTONE 25 MG PO TABS: 12.5000 mg | ORAL_TABLET | Freq: Every day | ORAL | 5 refills | Status: DC

## 2020-09-02 MED FILL — SPIRONOLACTONE 25 MG TABS: 25 | 30 days supply | Qty: 30 | Fill #0

## 2020-09-09 ENCOUNTER — Ambulatory Visit: Payer: BC Managed Care – PPO | Admitting: Cardiovascular Disease

## 2020-09-15 ENCOUNTER — Encounter: Payer: Self-pay | Admitting: Family Medicine

## 2020-09-20 ENCOUNTER — Ambulatory Visit: Payer: BC Managed Care – PPO | Admitting: Family Medicine

## 2020-09-21 ENCOUNTER — Encounter (HOSPITAL_COMMUNITY): Payer: Self-pay | Admitting: *Deleted

## 2020-09-21 ENCOUNTER — Emergency Department (HOSPITAL_COMMUNITY): Payer: BC Managed Care – PPO

## 2020-09-21 ENCOUNTER — Inpatient Hospital Stay (HOSPITAL_COMMUNITY): Payer: BC Managed Care – PPO

## 2020-09-21 ENCOUNTER — Other Ambulatory Visit: Payer: Self-pay

## 2020-09-21 ENCOUNTER — Inpatient Hospital Stay (HOSPITAL_COMMUNITY)
Admission: EM | Admit: 2020-09-21 | Discharge: 2020-10-01 | DRG: 377 | Disposition: E | Payer: BC Managed Care – PPO | Attending: Internal Medicine | Admitting: Internal Medicine

## 2020-09-21 DIAGNOSIS — Z452 Encounter for adjustment and management of vascular access device: Secondary | ICD-10-CM | POA: Diagnosis not present

## 2020-09-21 DIAGNOSIS — Z8679 Personal history of other diseases of the circulatory system: Secondary | ICD-10-CM | POA: Diagnosis not present

## 2020-09-21 DIAGNOSIS — K5731 Diverticulosis of large intestine without perforation or abscess with bleeding: Principal | ICD-10-CM | POA: Diagnosis present

## 2020-09-21 DIAGNOSIS — I712 Thoracic aortic aneurysm, without rupture: Secondary | ICD-10-CM | POA: Diagnosis not present

## 2020-09-21 DIAGNOSIS — G9382 Brain death: Secondary | ICD-10-CM | POA: Diagnosis not present

## 2020-09-21 DIAGNOSIS — R579 Shock, unspecified: Secondary | ICD-10-CM | POA: Diagnosis not present

## 2020-09-21 DIAGNOSIS — R001 Bradycardia, unspecified: Secondary | ICD-10-CM | POA: Diagnosis not present

## 2020-09-21 DIAGNOSIS — Z79899 Other long term (current) drug therapy: Secondary | ICD-10-CM

## 2020-09-21 DIAGNOSIS — Z4682 Encounter for fitting and adjustment of non-vascular catheter: Secondary | ICD-10-CM | POA: Diagnosis not present

## 2020-09-21 DIAGNOSIS — K921 Melena: Secondary | ICD-10-CM | POA: Diagnosis not present

## 2020-09-21 DIAGNOSIS — J9601 Acute respiratory failure with hypoxia: Secondary | ICD-10-CM | POA: Diagnosis present

## 2020-09-21 DIAGNOSIS — J690 Pneumonitis due to inhalation of food and vomit: Secondary | ICD-10-CM | POA: Diagnosis not present

## 2020-09-21 DIAGNOSIS — R4182 Altered mental status, unspecified: Secondary | ICD-10-CM | POA: Diagnosis not present

## 2020-09-21 DIAGNOSIS — Z8616 Personal history of COVID-19: Secondary | ICD-10-CM

## 2020-09-21 DIAGNOSIS — E876 Hypokalemia: Secondary | ICD-10-CM | POA: Diagnosis not present

## 2020-09-21 DIAGNOSIS — R918 Other nonspecific abnormal finding of lung field: Secondary | ICD-10-CM | POA: Diagnosis not present

## 2020-09-21 DIAGNOSIS — I499 Cardiac arrhythmia, unspecified: Secondary | ICD-10-CM | POA: Diagnosis not present

## 2020-09-21 DIAGNOSIS — T81509A Unspecified complication of foreign body accidentally left in body following unspecified procedure, initial encounter: Secondary | ICD-10-CM

## 2020-09-21 DIAGNOSIS — R569 Unspecified convulsions: Secondary | ICD-10-CM | POA: Diagnosis not present

## 2020-09-21 DIAGNOSIS — G931 Anoxic brain damage, not elsewhere classified: Secondary | ICD-10-CM | POA: Diagnosis present

## 2020-09-21 DIAGNOSIS — N179 Acute kidney failure, unspecified: Secondary | ICD-10-CM | POA: Diagnosis not present

## 2020-09-21 DIAGNOSIS — I469 Cardiac arrest, cause unspecified: Secondary | ICD-10-CM

## 2020-09-21 DIAGNOSIS — R0902 Hypoxemia: Secondary | ICD-10-CM | POA: Diagnosis not present

## 2020-09-21 DIAGNOSIS — G9389 Other specified disorders of brain: Secondary | ICD-10-CM | POA: Diagnosis not present

## 2020-09-21 DIAGNOSIS — U071 COVID-19: Secondary | ICD-10-CM | POA: Diagnosis not present

## 2020-09-21 DIAGNOSIS — I700 Atherosclerosis of aorta: Secondary | ICD-10-CM | POA: Diagnosis present

## 2020-09-21 DIAGNOSIS — R079 Chest pain, unspecified: Secondary | ICD-10-CM | POA: Diagnosis not present

## 2020-09-21 DIAGNOSIS — R109 Unspecified abdominal pain: Secondary | ICD-10-CM | POA: Diagnosis not present

## 2020-09-21 DIAGNOSIS — R55 Syncope and collapse: Secondary | ICD-10-CM | POA: Diagnosis not present

## 2020-09-21 DIAGNOSIS — Z529 Donor of unspecified organ or tissue: Secondary | ICD-10-CM

## 2020-09-21 DIAGNOSIS — M50322 Other cervical disc degeneration at C5-C6 level: Secondary | ICD-10-CM | POA: Diagnosis not present

## 2020-09-21 DIAGNOSIS — K625 Hemorrhage of anus and rectum: Secondary | ICD-10-CM | POA: Diagnosis not present

## 2020-09-21 DIAGNOSIS — R0789 Other chest pain: Secondary | ICD-10-CM | POA: Diagnosis not present

## 2020-09-21 DIAGNOSIS — I462 Cardiac arrest due to underlying cardiac condition: Secondary | ICD-10-CM

## 2020-09-21 DIAGNOSIS — I10 Essential (primary) hypertension: Secondary | ICD-10-CM | POA: Diagnosis not present

## 2020-09-21 DIAGNOSIS — G936 Cerebral edema: Secondary | ICD-10-CM | POA: Diagnosis not present

## 2020-09-21 DIAGNOSIS — M50321 Other cervical disc degeneration at C4-C5 level: Secondary | ICD-10-CM | POA: Diagnosis not present

## 2020-09-21 HISTORY — DX: Cardiac arrest, cause unspecified: I46.9

## 2020-09-21 LAB — ECHOCARDIOGRAM LIMITED
Height: 72 in
Weight: 4197.56 oz

## 2020-09-21 LAB — COOXEMETRY PANEL
Carboxyhemoglobin: 0.6 % (ref 0.5–1.5)
Methemoglobin: 1 % (ref 0.0–1.5)
O2 Saturation: 75.8 %
Total hemoglobin: 17.1 g/dL — ABNORMAL HIGH (ref 12.0–16.0)

## 2020-09-21 LAB — CBC WITH DIFFERENTIAL/PLATELET
Abs Immature Granulocytes: 0.81 10*3/uL — ABNORMAL HIGH (ref 0.00–0.07)
Basophils Absolute: 0.2 10*3/uL — ABNORMAL HIGH (ref 0.0–0.1)
Basophils Relative: 1 %
Eosinophils Absolute: 0.2 10*3/uL (ref 0.0–0.5)
Eosinophils Relative: 1 %
HCT: 44.6 % (ref 39.0–52.0)
Hemoglobin: 15.5 g/dL (ref 13.0–17.0)
Immature Granulocytes: 4 %
Lymphocytes Relative: 19 %
Lymphs Abs: 3.8 10*3/uL (ref 0.7–4.0)
MCH: 33.3 pg (ref 26.0–34.0)
MCHC: 34.8 g/dL (ref 30.0–36.0)
MCV: 95.9 fL (ref 80.0–100.0)
Monocytes Absolute: 0.8 10*3/uL (ref 0.1–1.0)
Monocytes Relative: 4 %
Neutro Abs: 14.4 10*3/uL — ABNORMAL HIGH (ref 1.7–7.7)
Neutrophils Relative %: 71 %
Platelets: 167 10*3/uL (ref 150–400)
RBC: 4.65 MIL/uL (ref 4.22–5.81)
RDW: 12.8 % (ref 11.5–15.5)
WBC: 20.1 10*3/uL — ABNORMAL HIGH (ref 4.0–10.5)
nRBC: 0.1 % (ref 0.0–0.2)

## 2020-09-21 LAB — RAPID URINE DRUG SCREEN, HOSP PERFORMED
Amphetamines: NOT DETECTED
Barbiturates: NOT DETECTED
Benzodiazepines: NOT DETECTED
Cocaine: NOT DETECTED
Opiates: NOT DETECTED
Tetrahydrocannabinol: NOT DETECTED

## 2020-09-21 LAB — COMPREHENSIVE METABOLIC PANEL
ALT: 90 U/L — ABNORMAL HIGH (ref 0–44)
AST: 87 U/L — ABNORMAL HIGH (ref 15–41)
Albumin: 3.7 g/dL (ref 3.5–5.0)
Alkaline Phosphatase: 104 U/L (ref 38–126)
Anion gap: 14 (ref 5–15)
BUN: 20 mg/dL (ref 8–23)
CO2: 20 mmol/L — ABNORMAL LOW (ref 22–32)
Calcium: 8.6 mg/dL — ABNORMAL LOW (ref 8.9–10.3)
Chloride: 105 mmol/L (ref 98–111)
Creatinine, Ser: 1.7 mg/dL — ABNORMAL HIGH (ref 0.61–1.24)
GFR, Estimated: 45 mL/min — ABNORMAL LOW (ref >60)
Glucose, Bld: 261 mg/dL — ABNORMAL HIGH (ref 70–99)
Potassium: 5.5 mmol/L — ABNORMAL HIGH (ref 3.5–5.1)
Sodium: 139 mmol/L (ref 135–145)
Total Bilirubin: 0.9 mg/dL (ref 0.3–1.2)
Total Protein: 6.3 g/dL — ABNORMAL LOW (ref 6.5–8.1)

## 2020-09-21 LAB — BPAM PLATELET PHERESIS
Blood Product Expiration Date: 202202202359
ISSUE DATE / TIME: 202202191855
Unit Type and Rh: 5100

## 2020-09-21 LAB — POCT I-STAT 7, (LYTES, BLD GAS, ICA,H+H)
Acid-base deficit: 5 mmol/L — ABNORMAL HIGH (ref 0.0–2.0)
Bicarbonate: 18 mmol/L — ABNORMAL LOW (ref 20.0–28.0)
Calcium, Ion: 1.45 mmol/L — ABNORMAL HIGH (ref 1.15–1.40)
HCT: 48 % (ref 39.0–52.0)
Hemoglobin: 16.3 g/dL (ref 13.0–17.0)
O2 Saturation: 99 %
Patient temperature: 35.7
Potassium: 3.3 mmol/L — ABNORMAL LOW (ref 3.5–5.1)
Sodium: 139 mmol/L (ref 135–145)
TCO2: 19 mmol/L — ABNORMAL LOW (ref 22–32)
pCO2 arterial: 27.4 mmHg — ABNORMAL LOW (ref 32.0–48.0)
pH, Arterial: 7.419 (ref 7.350–7.450)
pO2, Arterial: 117 mmHg — ABNORMAL HIGH (ref 83.0–108.0)

## 2020-09-21 LAB — BASIC METABOLIC PANEL
Anion gap: 13 (ref 5–15)
BUN: 28 mg/dL — ABNORMAL HIGH (ref 8–23)
CO2: 19 mmol/L — ABNORMAL LOW (ref 22–32)
Calcium: 10.2 mg/dL (ref 8.9–10.3)
Chloride: 106 mmol/L (ref 98–111)
Creatinine, Ser: 1.68 mg/dL — ABNORMAL HIGH (ref 0.61–1.24)
GFR, Estimated: 46 mL/min — ABNORMAL LOW (ref >60)
Glucose, Bld: 213 mg/dL — ABNORMAL HIGH (ref 70–99)
Potassium: 3.5 mmol/L (ref 3.5–5.1)
Sodium: 138 mmol/L (ref 135–145)

## 2020-09-21 LAB — BRAIN NATRIURETIC PEPTIDE: B Natriuretic Peptide: 48.6 pg/mL (ref 0.0–100.0)

## 2020-09-21 LAB — I-STAT ARTERIAL BLOOD GAS, ED
Acid-base deficit: 6 mmol/L — ABNORMAL HIGH (ref 0.0–2.0)
Bicarbonate: 22.8 mmol/L (ref 20.0–28.0)
Calcium, Ion: 1.21 mmol/L (ref 1.15–1.40)
HCT: 41 % (ref 39.0–52.0)
Hemoglobin: 13.9 g/dL (ref 13.0–17.0)
O2 Saturation: 94 %
Patient temperature: 98.6
Potassium: 5.1 mmol/L (ref 3.5–5.1)
Sodium: 137 mmol/L (ref 135–145)
TCO2: 25 mmol/L (ref 22–32)
pCO2 arterial: 59.3 mmHg — ABNORMAL HIGH (ref 32.0–48.0)
pH, Arterial: 7.193 — CL (ref 7.350–7.450)
pO2, Arterial: 88 mmHg (ref 83.0–108.0)

## 2020-09-21 LAB — URINALYSIS, ROUTINE W REFLEX MICROSCOPIC
Bilirubin Urine: NEGATIVE
Glucose, UA: 150 mg/dL — AB
Ketones, ur: NEGATIVE mg/dL
Leukocytes,Ua: NEGATIVE
Nitrite: NEGATIVE
Protein, ur: >=300 mg/dL — AB
Specific Gravity, Urine: 1.016 (ref 1.005–1.030)
pH: 6 (ref 5.0–8.0)

## 2020-09-21 LAB — I-STAT CHEM 8, ED
BUN: 30 mg/dL — ABNORMAL HIGH (ref 8–23)
Calcium, Ion: 1.16 mmol/L (ref 1.15–1.40)
Chloride: 102 mmol/L (ref 98–111)
Creatinine, Ser: 1.5 mg/dL — ABNORMAL HIGH (ref 0.61–1.24)
Glucose, Bld: 251 mg/dL — ABNORMAL HIGH (ref 70–99)
HCT: 44 % (ref 39.0–52.0)
Hemoglobin: 15 g/dL (ref 13.0–17.0)
Potassium: 5.4 mmol/L — ABNORMAL HIGH (ref 3.5–5.1)
Sodium: 137 mmol/L (ref 135–145)
TCO2: 26 mmol/L (ref 22–32)

## 2020-09-21 LAB — PROTIME-INR
INR: 1.2 (ref 0.8–1.2)
INR: 1.2 (ref 0.8–1.2)
INR: 1.1 (ref 0.8–1.2)
Prothrombin Time: 14.4 seconds (ref 11.4–15.2)
Prothrombin Time: 14.9 seconds (ref 11.4–15.2)
Prothrombin Time: 13.4 seconds (ref 11.4–15.2)

## 2020-09-21 LAB — HEMOGLOBIN A1C
Hgb A1c MFr Bld: 5.2 % (ref 4.8–5.6)
Mean Plasma Glucose: 102.54 mg/dL

## 2020-09-21 LAB — RESP PANEL BY RT-PCR (FLU A&B, COVID) ARPGX2
Influenza A by PCR: NEGATIVE
Influenza B by PCR: NEGATIVE
SARS Coronavirus 2 by RT PCR: POSITIVE — AB

## 2020-09-21 LAB — PREPARE PLATELET PHERESIS: Unit division: 0

## 2020-09-21 LAB — CBG MONITORING, ED: Glucose-Capillary: 228 mg/dL — ABNORMAL HIGH (ref 70–99)

## 2020-09-21 LAB — TROPONIN I (HIGH SENSITIVITY)
Troponin I (High Sensitivity): 28 ng/L — ABNORMAL HIGH (ref <18)
Troponin I (High Sensitivity): 301 ng/L (ref <18)

## 2020-09-21 LAB — LACTIC ACID, PLASMA
Lactic Acid, Venous: 4.1 mmol/L (ref 0.5–1.9)
Lactic Acid, Venous: 4.4 mmol/L (ref 0.5–1.9)

## 2020-09-21 LAB — HEMOGLOBIN AND HEMATOCRIT, BLOOD
HCT: 45.6 % (ref 39.0–52.0)
HCT: 47 % (ref 39.0–52.0)
Hemoglobin: 17 g/dL (ref 13.0–17.0)
Hemoglobin: 17.3 g/dL — ABNORMAL HIGH (ref 13.0–17.0)

## 2020-09-21 LAB — APTT: aPTT: 30 seconds (ref 24–36)

## 2020-09-21 LAB — MASSIVE TRANSFUSION PROTOCOL ORDER (BLOOD BANK NOTIFICATION)

## 2020-09-21 LAB — ABO/RH: ABO/RH(D): O POS

## 2020-09-21 LAB — GLUCOSE, CAPILLARY
Glucose-Capillary: 163 mg/dL — ABNORMAL HIGH (ref 70–99)
Glucose-Capillary: 165 mg/dL — ABNORMAL HIGH (ref 70–99)

## 2020-09-21 MED ORDER — NOREPINEPHRINE 4 MG/250ML-% IV SOLN
INTRAVENOUS | Status: AC
  Administered 2020-09-21: 5 ug/min
  Filled 2020-09-21: qty 250

## 2020-09-21 MED ORDER — POTASSIUM CHLORIDE 10 MEQ/50ML IV SOLN
10.0000 meq | INTRAVENOUS | Status: AC
  Administered 2020-09-21 (×4): 10 meq via INTRAVENOUS
  Filled 2020-09-21 (×4): qty 50

## 2020-09-21 MED ORDER — FENTANYL 2500MCG IN NS 250ML (10MCG/ML) PREMIX INFUSION
100.0000 ug/h | INTRAVENOUS | Status: DC
  Administered 2020-09-21 – 2020-09-24 (×4): 100 ug/h via INTRAVENOUS
  Filled 2020-09-21 (×4): qty 250

## 2020-09-21 MED ORDER — CHLORHEXIDINE GLUCONATE 0.12% ORAL RINSE (MEDLINE KIT)
15.0000 mL | Freq: Two times a day (BID) | OROMUCOSAL | Status: DC
  Administered 2020-09-21 – 2020-09-28 (×14): 15 mL via OROMUCOSAL

## 2020-09-21 MED ORDER — IOHEXOL 350 MG/ML SOLN
60.0000 mL | Freq: Once | INTRAVENOUS | Status: AC | PRN
  Administered 2020-09-21: 60 mL via INTRAVENOUS

## 2020-09-21 MED ORDER — SODIUM CHLORIDE 0.9% FLUSH
10.0000 mL | Freq: Two times a day (BID) | INTRAVENOUS | Status: DC
  Administered 2020-09-21: 10 mL
  Administered 2020-09-22: 20 mL
  Administered 2020-09-22 – 2020-09-28 (×11): 10 mL

## 2020-09-21 MED ORDER — PANTOPRAZOLE SODIUM 40 MG IV SOLR: 40.0000 mg | Freq: Every day | INTRAVENOUS | Status: DC

## 2020-09-21 MED ORDER — ASPIRIN 300 MG RE SUPP
300.0000 mg | RECTAL | Status: AC
  Administered 2020-09-21: 300 mg via RECTAL
  Filled 2020-09-21: qty 1

## 2020-09-21 MED ORDER — NOREPINEPHRINE 4 MG/250ML-% IV SOLN
0.0000 ug/min | INTRAVENOUS | Status: DC
  Administered 2020-09-21: 8 ug/min via INTRAVENOUS
  Filled 2020-09-21: qty 250

## 2020-09-21 MED ORDER — SODIUM CHLORIDE 0.9 % IV SOLN
INTRAVENOUS | Status: DC
  Administered 2020-09-21: 10 mL/h via INTRAVENOUS

## 2020-09-21 MED ORDER — PANTOPRAZOLE SODIUM 40 MG IV SOLR
40.0000 mg | Freq: Two times a day (BID) | INTRAVENOUS | Status: DC
  Administered 2020-09-21 – 2020-09-26 (×11): 40 mg via INTRAVENOUS
  Filled 2020-09-21 (×11): qty 40

## 2020-09-21 MED ORDER — MEPERIDINE HCL 25 MG/ML IJ SOLN: 25.0000 mg | INTRAMUSCULAR | Status: DC | PRN

## 2020-09-21 MED ORDER — INSULIN ASPART 100 UNIT/ML ~~LOC~~ SOLN
0.0000 [IU] | SUBCUTANEOUS | Status: DC
  Administered 2020-09-21 (×2): 2 [IU] via SUBCUTANEOUS
  Administered 2020-09-21: 3 [IU] via SUBCUTANEOUS
  Administered 2020-09-22 (×3): 2 [IU] via SUBCUTANEOUS
  Administered 2020-09-22: 1 [IU] via SUBCUTANEOUS
  Administered 2020-09-22: 2 [IU] via SUBCUTANEOUS
  Administered 2020-09-23 (×5): 1 [IU] via SUBCUTANEOUS
  Administered 2020-09-23 – 2020-09-24 (×2): 2 [IU] via SUBCUTANEOUS
  Administered 2020-09-24: 1 [IU] via SUBCUTANEOUS
  Administered 2020-09-24 – 2020-09-25 (×8): 2 [IU] via SUBCUTANEOUS
  Administered 2020-09-25: 1 [IU] via SUBCUTANEOUS
  Administered 2020-09-26 (×4): 2 [IU] via SUBCUTANEOUS
  Administered 2020-09-26: 3 [IU] via SUBCUTANEOUS
  Administered 2020-09-26 – 2020-09-27 (×2): 2 [IU] via SUBCUTANEOUS
  Administered 2020-09-27: 3 [IU] via SUBCUTANEOUS
  Administered 2020-09-27: 9 [IU] via SUBCUTANEOUS
  Administered 2020-09-27 (×2): 3 [IU] via SUBCUTANEOUS

## 2020-09-21 MED ORDER — CHLORHEXIDINE GLUCONATE CLOTH 2 % EX PADS
6.0000 | MEDICATED_PAD | Freq: Every day | CUTANEOUS | Status: DC
  Administered 2020-09-21 – 2020-09-27 (×7): 6 via TOPICAL

## 2020-09-21 MED ORDER — SODIUM CHLORIDE 0.9 % IV BOLUS
1000.0000 mL | Freq: Once | INTRAVENOUS | Status: AC
  Administered 2020-09-21: 1000 mL via INTRAVENOUS

## 2020-09-21 MED ORDER — SODIUM CHLORIDE 0.9 % IV SOLN
2.0000 g | INTRAVENOUS | Status: AC
  Administered 2020-09-21 – 2020-09-25 (×5): 2 g via INTRAVENOUS
  Filled 2020-09-21 (×2): qty 2
  Filled 2020-09-21: qty 20
  Filled 2020-09-21 (×2): qty 2

## 2020-09-21 MED ORDER — SODIUM ZIRCONIUM CYCLOSILICATE 10 G PO PACK
10.0000 g | PACK | Freq: Once | ORAL | Status: AC
  Administered 2020-09-21: 10 g via ORAL
  Filled 2020-09-21: qty 1

## 2020-09-21 MED ORDER — SODIUM CHLORIDE 0.9% FLUSH: 10.0000 mL | INTRAVENOUS | Status: DC | PRN

## 2020-09-21 MED ORDER — FENTANYL BOLUS VIA INFUSION
50.0000 ug | INTRAVENOUS | Status: DC | PRN
  Administered 2020-09-23 – 2020-09-24 (×2): 50 ug via INTRAVENOUS
  Filled 2020-09-21: qty 50

## 2020-09-21 MED ORDER — ORAL CARE MOUTH RINSE
15.0000 mL | OROMUCOSAL | Status: DC
  Administered 2020-09-21 – 2020-09-28 (×65): 15 mL via OROMUCOSAL

## 2020-09-21 MED ORDER — PROPOFOL 1000 MG/100ML IV EMUL
25.0000 ug/kg/min | INTRAVENOUS | Status: DC
  Administered 2020-09-21 – 2020-09-22 (×5): 25 ug/kg/min via INTRAVENOUS
  Administered 2020-09-22 – 2020-09-23 (×2): 30 ug/kg/min via INTRAVENOUS
  Filled 2020-09-21 (×10): qty 100

## 2020-09-21 MED ORDER — FENTANYL CITRATE (PF) 100 MCG/2ML IJ SOLN
100.0000 ug | Freq: Once | INTRAMUSCULAR | Status: AC
  Administered 2020-09-21: 100 ug via INTRAVENOUS
  Filled 2020-09-21: qty 2

## 2020-09-21 NOTE — ED Notes (Signed)
Levo drip decreased to 5

## 2020-09-21 NOTE — Procedures (Signed)
Arterial Catheter Insertion Procedure Note  Jonathon Patel  [id number]  1956/07/19  Date:09/10/2020  Time:4:17 PM    Provider Performing: Lorin Glass    Procedure: Insertion of Arterial Line (48628) with US guidance (24175)   Indication(s) Blood pressure monitoring and/or need for frequent ABGs  Consent Unable to obtain consent due to emergent nature of procedure.  Anesthesia None   Time Out Verified patient identification, verified procedure, site/side was marked, verified correct patient position, special equipment/implants available, medications/allergies/relevant history reviewed, required imaging and test results available.   Sterile Technique Maximal sterile technique including full sterile barrier drape, hand hygiene, sterile gown, sterile gloves, mask, hair covering, sterile ultrasound probe cover (if used).   Procedure Description Area of catheter insertion was cleaned with chlorhexidine and draped in sterile fashion. Without real-time ultrasound guidance an arterial catheter was placed into the right radial artery.  Appropriate arterial tracings confirmed on monitor.     Complications/Tolerance None; patient tolerated the procedure well.   EBL Minimal   Specimen(s) None
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
## 2020-09-21 NOTE — ED Notes (Signed)
IO removed right tib.

## 2020-09-21 NOTE — Progress Notes (Signed)
vLTM EEG started following spot EEG (same leads). Notified neuro.

## 2020-09-21 NOTE — Progress Notes (Signed)
eLink Physician-Brief Progress Note Patient Name: Jonathon Patel DOB: [birth year] MRN: [id number]   Date of Service  09/03/2020  HPI/Events of Note  Nursing request to D/C Q 30 min DIC panel and H/H and massive blood transfusion orders.   eICU Interventions  Plan:  1. D/C Q 30 min DIC panel and H/H. 2. D/C Massive transfusion orders.      Intervention Category Major Interventions: Other:  Lenell Antu 09/24/2020, 9:18 PM

## 2020-09-21 NOTE — Progress Notes (Addendum)
Started having massive hematochezia >1.5L This may explain the recurrent arrests (vagal vs. Hemorrhagic). MTP 4/4/1. Called Dr. Loreta Ave who will review images and see if anything can be offered, appreciate help. Will update wife.  Additional 75 minutes cc time throughout day managing.  Myrla Halsted MD PCCM

## 2020-09-21 NOTE — ED Notes (Signed)
Assumed care from [place], RN, pt intubated, VS monitor attached to the pt, bilateral rails up. Pt on ventilator with no sedation. Levophed running as prior RN set it up going at 5.

## 2020-09-21 NOTE — Progress Notes (Signed)
  Echocardiogram 2D Echocardiogram has been performed.  Jonathon Patel 09/29/2020, 10:41 AM

## 2020-09-21 NOTE — Consult Note (Signed)
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 by Weston Brass MD Signature Date/Time: Oct 07, 2020/10:49:57 AM    Final     Labs:  CBC: Recent Labs    02/07/20 0820 October 07, 2020 1001 07-Oct-2020 1007 10-07-2020 1018 10/07/20 1612 October 07, 2020 1819  WBC 7.3  --  20.1*  --   --   --   HGB 15.7   < > 15.5 13.9 16.3 17.0  HCT 44.6   < > 44.6 41.0 48.0 45.6  PLT 246.0  --  167  --   --   --    < > = values in this interval not displayed.    COAGS: Recent Labs    2020/10/07 1135 10/07/20 1627  INR 1.2 1.2  APTT  --  30    BMP: Recent Labs    02/07/20 0820 08/09/20 1549 Oct 07, 2020 1001 10/07/2020 1007 10/07/20 1018 07-Oct-2020 1612 10/07/2020 1627  NA 140 140 137 139 137 139 138  K 3.4* 3.5 5.4* 5.5* 5.1 3.3* 3.5  CL 102 101 102 105  --   --  106  CO2 29 32  --  20*  --   --  19*  GLUCOSE 114* 85 251* 261*  --   --  213*  BUN 12 21 30* 20  --   --  28*  CALCIUM 9.6 10.0  --  8.6*  --   --  10.2  CREATININE 1.09 1.15 1.50* 1.70*  --   --  1.68*  GFRNONAA  --   --   --  45*  --   --  46*    LIVER FUNCTION TESTS: Recent Labs    02/07/20 0820 10/07/2020 1007  BILITOT 0.8 0.9  AST 20 87*  ALT 27 90*  ALKPHOS 68 104  PROT 6.9 6.3*  ALBUMIN 4.6 3.7    TUMOR  MARKERS: No results for input(s): AFPTM, CEA, CA199, CHROMGRNA in the last 8760 hours.  Assessment and Plan:  Jonathon Patel is 65 yo male with acute hematochezia/BRBPR, SP CPR  with 7 rounds of epi per notes.   At this time, he is hemodynamically normal, with no pressor ggt currently running.    The previous CT of the abd/pelvis is non-diagnostic for site of hemorrhage, with diffuse colonic fluid.    Discussed with Dr. Nicole Cella of [facility], and updated his nurse, Greig Castilla.   Suggest observation for now given normal vital signs, and low yield for formal angiogram at this time.  Suggest continuing to trend H&H.   1- If becomes unstable, recommend CTA abd/pelvis, with 3 phase study, to identify possible site of bleed, and consider embolization 2 - If continues to have slow hematochezia overnight, suggest NM bleeding study in the am on Sunday   VIR available  Pager: [phone]   Thank you for this interesting consult.  I greatly enjoyed meeting Anias Bartol and look forward to participating in their care.  A copy of this report was sent to the requesting provider on this date.  Electronically Signed: Gilmer Mor, DO 09/18/2020, 7:43 PM   I spent a total of 55 Miinutes    in face to face in clinical consultation, greater than 50% of which was counseling/coordinating care for acute hematochezia, possible angiogram, possible embolization

## 2020-09-21 NOTE — Progress Notes (Signed)
Patient came in with a king airway, ED physician exchanged with a 7.5 ETT taped at 23 cm at lip, good BBS ausculted, SATS 92%, good color change on ETCO2 detector, placed on above vent settings per ARDS net protocol, will continue to monitor patient.

## 2020-09-21 NOTE — Progress Notes (Addendum)
MTP- Blood Documentation - Initiated at 1845 - Rapid Tranfuser  Unit #: [id number] Component:  RED CELLS, LR ABO/Rh: O-Positive Volume: 315 Start: 1845 Stop: 1847  Unit #: [id number] Component: RBC LR PHER2 ABO/Rh: O-Positive Volume: 323 Start: 1848 Stop: 1850  Unit #: [id number] Component: THW PLS APHR ABO/Rh: A-Positive Volume: 201 Start: 1851 Stop: 1853  Unit #: [id number] V Component: THW PLS APHR  ABO/Rh: A-Positive Volume: 199 Start: 1854 Stop: 1856  Unit #: [id number] Component: RED CELLS, LR ABO/Rh: O-Positive Volume: 315 Start: 1857 Stop: 1859  Unit #: [id number] P Component: RBC LR PHER1 ABO/Rh: O-Positive  Volume: 279 Start: 1900  Stop: 1902  Unit #: [id number] F Component: THW PLS APHR ABO/Rh: A-Positive  Volume: 191 Start: 1903 Stop: 1905  Unit #: [id number] P Component: THW PLS APHR ABO/Rh: A-Positive  Volume: 200 Start: 1906 Stop: 1908  Blood Products Given from verbal orders by Myrla Halsted, MD.  Blood Products verified by Gladstone Pih, RN and Shaun, RN.    VS obtained prior/during/post transfusion with no suspect of transfusion reaction.

## 2020-09-21 NOTE — Progress Notes (Signed)
EEG completed, results pending. 

## 2020-09-21 NOTE — Progress Notes (Signed)
   09/19/2020 1100  Clinical Encounter Type  Visited With Family  Visit Type Social support  Referral From Nurse  Consult/Referral To Chaplain  Spiritual Encounters  Spiritual Needs Emotional  The chaplain responded to the page to support the family of the patient. The family is in consult room A. The chaplain has provided social support to the family while the patient is undergoing tests. The healthcare staff has been helpful in keeping the family informed.

## 2020-09-21 NOTE — ED Triage Notes (Signed)
Patient presents to ed via [facility] states patient was c/o chest pain last pm and this am , also c/o nausea this am wen to the bathroom , wife didn't hear from him after about 5 min went into bathroom and found patient unresp in the bathroom.  0830 ems found patient unresp, pulse and apneic on the bathroom floor started CPR return of ROSC at 0909, ,loss pulses@0919  ROSC @0924  0945 King airway removed per Dr. and intubated with 7.5 23@lip . 0943 Levo started at 10 0948 Levo increased to 20 b/p55/39 hr-69, 94% bagging 0950 1 liter NS up to infuse rapidly 0954 Cards ato bedside b/p 111/81 hr-76 95% vent 0956 levo decreased tp 08-26-1987. 1002 b/p 142/94 hr-70 epip drip off per Cards.

## 2020-09-21 NOTE — ED Notes (Signed)
Report given waiting for RT to transport.

## 2020-09-21 NOTE — Procedures (Signed)
Central Venous Catheter Insertion Procedure Note  Jonathon Patel  [id number]  20-May-1956  Date:09/20/2020  Time:2:58 PM   Provider Performing:Quan Cybulski Erby Pian   Procedure: Insertion of Non-tunneled Central Venous [phone]) with US guidance ([id number])   Indication(s) Medication administration  Consent Obtained verbally from family in ER  Anesthesia Topical only with 1% lidocaine   Timeout Verified patient identification, verified procedure, site/side was marked, verified correct patient position, special equipment/implants available, medications/allergies/relevant history reviewed, required imaging and test results available.  Sterile Technique Maximal sterile technique including full sterile barrier drape, hand hygiene, sterile gown, sterile gloves, mask, hair covering, sterile ultrasound probe cover (if used).  Procedure Description Area of catheter insertion was cleaned with chlorhexidine and draped in sterile fashion.  With real-time ultrasound guidance a central venous catheter was placed into the left internal jugular vein. Nonpulsatile blood flow and easy flushing noted in all ports.  The catheter was sutured in place and sterile dressing applied.  Complications/Tolerance None; patient tolerated the procedure well. Chest X-ray is ordered to verify placement for internal jugular or subclavian cannulation.   Chest x-ray is not ordered for femoral cannulation.  EBL Minimal  Specimen(s) None

## 2020-09-21 NOTE — Progress Notes (Signed)
Pt transported from ED to 2H. No complications during transport, upon transfer from stretcher to bed patient went into PEA. Code blue initiated.

## 2020-09-21 NOTE — Procedures (Signed)
Cardiopulmonary Resuscitation Note  Jasmon Graffam  [id number]  1956/07/19  Date:09/06/2020  Time:4:15 PM   Provider Performing:Amarah Brossman Erby Pian   Procedure: Cardiopulmonary Resuscitation [phone])  Indication(s) Loss of Pulse  Consent N/A  Anesthesia N/A   Time Out N/A   Sterile Technique Hand hygiene, gloves   Procedure Description Called to patient's room for CODE BLUE. Initial rhythm was PEA/Asystole. Patient received high quality chest compressions for 3 minutes with defibrillation or cardioversion when appropriate. Epinephrine was administered every 3 minutes as directed by time Biomedical engineer. Additional pharmacologic interventions included calcium chloride and sodium bicarbonate.  Return of spontaneous circulation was achieved.  Family called and notified.   Complications/Tolerance N/A   EBL N/A   Specimen(s) N/A  Estimated time to ROSC: 3 minutes
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
## 2020-09-21 NOTE — Progress Notes (Signed)
CT head I think shows some grey-white differentiation loss but not called. CT chest with expected aspiration CTA w/o anything to explain arrest Incidental dilated colon without signs of colitis  Proceed with TTM, family updated at length.  Myrla Halsted MD PCCM
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
## 2020-09-21 NOTE — Progress Notes (Signed)
eLink Physician-Brief Progress Note Patient Name: Jonathon Patel DOB: 05/28/1956 MRN: [id number]   Date of Service  09/12/2020  HPI/Events of Note  Hypokalemia - K+ = 3.5 and Creatinine = 1.68.   eICU Interventions  Will replace K+.      Intervention Category Major Interventions: Electrolyte abnormality - evaluation and management  Lenell Antu 09/27/2020, 8:47 PM

## 2020-09-22 DIAGNOSIS — I469 Cardiac arrest, cause unspecified: Secondary | ICD-10-CM | POA: Diagnosis not present

## 2020-09-22 LAB — PREPARE FRESH FROZEN PLASMA
Unit division: 0
Unit division: 0
Unit division: 0
Unit division: 0

## 2020-09-22 LAB — BASIC METABOLIC PANEL WITH GFR
Anion gap: 13 (ref 5–15)
BUN: 29 mg/dL — ABNORMAL HIGH (ref 8–23)
CO2: 17 mmol/L — ABNORMAL LOW (ref 22–32)
Calcium: 9 mg/dL (ref 8.9–10.3)
Chloride: 108 mmol/L (ref 98–111)
Creatinine, Ser: 1.38 mg/dL — ABNORMAL HIGH (ref 0.61–1.24)
GFR, Estimated: 58 mL/min — ABNORMAL LOW
Glucose, Bld: 172 mg/dL — ABNORMAL HIGH (ref 70–99)
Potassium: 3.4 mmol/L — ABNORMAL LOW (ref 3.5–5.1)
Sodium: 138 mmol/L (ref 135–145)

## 2020-09-22 LAB — CBC
HCT: 47.2 % (ref 39.0–52.0)
Hemoglobin: 17.4 g/dL — ABNORMAL HIGH (ref 13.0–17.0)
MCH: 31.8 pg (ref 26.0–34.0)
MCHC: 36.9 g/dL — ABNORMAL HIGH (ref 30.0–36.0)
MCV: 86.1 fL (ref 80.0–100.0)
Platelets: 170 10*3/uL (ref 150–400)
RBC: 5.48 MIL/uL (ref 4.22–5.81)
RDW: 13.4 % (ref 11.5–15.5)
WBC: 17 10*3/uL — ABNORMAL HIGH (ref 4.0–10.5)
nRBC: 0 % (ref 0.0–0.2)

## 2020-09-22 LAB — BASIC METABOLIC PANEL
Anion gap: 14 (ref 5–15)
Anion gap: 12 (ref 5–15)
BUN: 31 mg/dL — ABNORMAL HIGH (ref 8–23)
BUN: 30 mg/dL — ABNORMAL HIGH (ref 8–23)
CO2: 15 mmol/L — ABNORMAL LOW (ref 22–32)
CO2: 18 mmol/L — ABNORMAL LOW (ref 22–32)
Calcium: 9.2 mg/dL (ref 8.9–10.3)
Calcium: 8.4 mg/dL — ABNORMAL LOW (ref 8.9–10.3)
Chloride: 108 mmol/L (ref 98–111)
Chloride: 104 mmol/L (ref 98–111)
Creatinine, Ser: 1.44 mg/dL — ABNORMAL HIGH (ref 0.61–1.24)
Creatinine, Ser: 1.21 mg/dL (ref 0.61–1.24)
GFR, Estimated: 55 mL/min — ABNORMAL LOW (ref >60)
GFR, Estimated: >60 mL/min (ref >60)
Glucose, Bld: 172 mg/dL — ABNORMAL HIGH (ref 70–99)
Glucose, Bld: 166 mg/dL — ABNORMAL HIGH (ref 70–99)
Potassium: 3.3 mmol/L — ABNORMAL LOW (ref 3.5–5.1)
Potassium: 3 mmol/L — ABNORMAL LOW (ref 3.5–5.1)
Sodium: 137 mmol/L (ref 135–145)
Sodium: 134 mmol/L — ABNORMAL LOW (ref 135–145)

## 2020-09-22 LAB — BPAM FFP
Blood Product Expiration Date: 202202212359
Blood Product Expiration Date: 202202212359
Blood Product Expiration Date: 202202212359
Blood Product Expiration Date: 202202222359
Blood Product Expiration Date: 202202222359
Blood Product Expiration Date: 202202242359
Blood Product Expiration Date: 202202242359
Blood Product Expiration Date: 202203132359
ISSUE DATE / TIME: 202202191825
ISSUE DATE / TIME: 202202191825
ISSUE DATE / TIME: 202202191825
ISSUE DATE / TIME: 202202191825
ISSUE DATE / TIME: 202202191849
ISSUE DATE / TIME: 202202191849
ISSUE DATE / TIME: 202202191849
ISSUE DATE / TIME: 202202191849
Unit Type and Rh: 6200
Unit Type and Rh: 6200
Unit Type and Rh: 6200
Unit Type and Rh: 6200
Unit Type and Rh: 6200
Unit Type and Rh: 6200
Unit Type and Rh: 6200
Unit Type and Rh: 6200

## 2020-09-22 LAB — GLUCOSE, CAPILLARY
Glucose-Capillary: 151 mg/dL — ABNORMAL HIGH (ref 70–99)
Glucose-Capillary: 156 mg/dL — ABNORMAL HIGH (ref 70–99)
Glucose-Capillary: 159 mg/dL — ABNORMAL HIGH (ref 70–99)
Glucose-Capillary: 159 mg/dL — ABNORMAL HIGH (ref 70–99)
Glucose-Capillary: 169 mg/dL — ABNORMAL HIGH (ref 70–99)
Glucose-Capillary: 82 mg/dL (ref 70–99)

## 2020-09-22 LAB — DIC (DISSEMINATED INTRAVASCULAR COAGULATION)PANEL
D-Dimer, Quant: >20 ug/mL-FEU — ABNORMAL HIGH (ref 0.00–0.50)
Fibrinogen: 343 mg/dL (ref 210–475)
INR: 1.1 (ref 0.8–1.2)
Platelets: 167 10*3/uL (ref 150–400)
Prothrombin Time: 13.5 seconds (ref 11.4–15.2)
Smear Review: NONE SEEN
aPTT: 31 seconds (ref 24–36)

## 2020-09-22 LAB — PHOSPHORUS: Phosphorus: 2.5 mg/dL (ref 2.5–4.6)

## 2020-09-22 LAB — MAGNESIUM: Magnesium: 1.9 mg/dL (ref 1.7–2.4)

## 2020-09-22 LAB — HEMOGLOBIN AND HEMATOCRIT, BLOOD
HCT: 47.5 % (ref 39.0–52.0)
HCT: 45.5 % (ref 39.0–52.0)
Hemoglobin: 17 g/dL (ref 13.0–17.0)
Hemoglobin: 16.4 g/dL (ref 13.0–17.0)

## 2020-09-22 LAB — TROPONIN I (HIGH SENSITIVITY): Troponin I (High Sensitivity): 247 ng/L (ref <18)

## 2020-09-22 MED ORDER — POTASSIUM CHLORIDE 10 MEQ/50ML IV SOLN
10.0000 meq | INTRAVENOUS | Status: AC
  Administered 2020-09-22 (×4): 10 meq via INTRAVENOUS
  Filled 2020-09-22 (×4): qty 50

## 2020-09-22 MED ORDER — MAGNESIUM SULFATE 2 GM/50ML IV SOLN
2.0000 g | Freq: Once | INTRAVENOUS | Status: AC
  Administered 2020-09-22: 2 g via INTRAVENOUS
  Filled 2020-09-22: qty 50

## 2020-09-22 MED ORDER — ACETAMINOPHEN 160 MG/5ML PO SOLN
650.0000 mg | Freq: Four times a day (QID) | ORAL | Status: DC | PRN
  Administered 2020-09-22 – 2020-09-25 (×4): 650 mg
  Filled 2020-09-22 (×4): qty 20.3

## 2020-09-22 MED ORDER — VITAL HIGH PROTEIN PO LIQD
1000.0000 mL | ORAL | Status: DC
  Administered 2020-09-22 – 2020-09-23 (×2): 1000 mL
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
## 2020-09-22 NOTE — Progress Notes (Signed)
[facility] ADULT ICU REPLACEMENT PROTOCOL   The patient does apply for the [facility] Adult ICU Electrolyte Replacment Protocol based on the criteria listed below:   1. Is GFR >/= 30 ml/min? Yes.    Patient's GFR today is 55 2. Is SCr </= 2? Yes.   Patient's SCr is 1.44 ml/kg/hr 3. Did SCr increase >/= 0.5 in 24 hours? No. 4. Abnormal electrolyte(s): Mag 1.9 5. Ordered repletion with: protocol 6. If a panic level lab has been reported, has the CCM MD in charge been notified? Yes.  .   Physician:  Dr. Althia Forts, Lilia Argue 09/22/2020 6:41 AM

## 2020-09-22 NOTE — Progress Notes (Signed)
Brief Nutrition Note RD working remotely.   Consult received for enteral/tube feeding initiation and management.  Adult Enteral Nutrition Protocol initiated. Order in place from CCM for Vital High Protein @ 20 ml/hr. Continue this regimen. Full assessment to follow.  Admitting Dx: Cardiac arrest (HCC) [I46.9] COVID [U07.1]. Patient noted to be COVID-19 positive.   Body mass index is 35.58 kg/m. Pt meets criteria for obesity based on current BMI.  Labs:  Recent Labs  Lab 09/18/2020 1627 09/22/20 0107 09/22/20 0405 09/22/20 0754  NA 138 137  --  138  K 3.5 3.3*  --  3.4*  CL 106 108  --  108  CO2 19* 15*  --  17*  BUN 28* 31*  --  29*  CREATININE 1.68* 1.44*  --  1.38*  CALCIUM 10.2 9.2  --  9.0  MG  --   --  1.9  --   PHOS  --   --  2.5  --   GLUCOSE 213* 172*  --  172*       Trenton Gammon, MS, RD, LDN, CNSC Inpatient Clinical Dietitian RD pager # available in AMION  After hours/weekend pager # available in [facility]
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
## 2020-09-22 NOTE — Progress Notes (Signed)
eLink Physician-Brief Progress Note Patient Name: Jonathon Patel DOB: 08-12-1955 MRN: [id number]   Date of Service  09/22/2020  HPI/Events of Note  Hypokalemia - K+ = 3.3 and Creatinine = 1.44.   eICU Interventions  Will replace K+.     Intervention Category Major Interventions: Electrolyte abnormality - evaluation and management  Sommer,Steven Eugene 09/22/2020, 2:41 AM

## 2020-09-22 NOTE — Progress Notes (Signed)
EEG maintenance complete. No skin breakdown at [address] continue to monitor

## 2020-09-23 DIAGNOSIS — G931 Anoxic brain damage, not elsewhere classified: Secondary | ICD-10-CM | POA: Diagnosis not present

## 2020-09-23 DIAGNOSIS — I469 Cardiac arrest, cause unspecified: Secondary | ICD-10-CM | POA: Diagnosis not present

## 2020-09-23 DIAGNOSIS — U071 COVID-19: Secondary | ICD-10-CM

## 2020-09-23 LAB — BASIC METABOLIC PANEL
Anion gap: 12 (ref 5–15)
BUN: 29 mg/dL — ABNORMAL HIGH (ref 8–23)
CO2: 18 mmol/L — ABNORMAL LOW (ref 22–32)
Calcium: 8.4 mg/dL — ABNORMAL LOW (ref 8.9–10.3)
Chloride: 109 mmol/L (ref 98–111)
Creatinine, Ser: 1.15 mg/dL (ref 0.61–1.24)
GFR, Estimated: >60 mL/min (ref >60)
Glucose, Bld: 138 mg/dL — ABNORMAL HIGH (ref 70–99)
Potassium: 3 mmol/L — ABNORMAL LOW (ref 3.5–5.1)
Sodium: 139 mmol/L (ref 135–145)

## 2020-09-23 LAB — CBC
HCT: 42.7 % (ref 39.0–52.0)
Hemoglobin: 15.1 g/dL (ref 13.0–17.0)
MCH: 31.3 pg (ref 26.0–34.0)
MCHC: 35.4 g/dL (ref 30.0–36.0)
MCV: 88.4 fL (ref 80.0–100.0)
Platelets: 152 10*3/uL (ref 150–400)
RBC: 4.83 MIL/uL (ref 4.22–5.81)
RDW: 14.2 % (ref 11.5–15.5)
WBC: 15.8 10*3/uL — ABNORMAL HIGH (ref 4.0–10.5)
nRBC: 0 % (ref 0.0–0.2)

## 2020-09-23 LAB — GLUCOSE, CAPILLARY
Glucose-Capillary: 132 mg/dL — ABNORMAL HIGH (ref 70–99)
Glucose-Capillary: 154 mg/dL — ABNORMAL HIGH (ref 70–99)
Glucose-Capillary: 137 mg/dL — ABNORMAL HIGH (ref 70–99)
Glucose-Capillary: 133 mg/dL — ABNORMAL HIGH (ref 70–99)
Glucose-Capillary: 149 mg/dL — ABNORMAL HIGH (ref 70–99)
Glucose-Capillary: 142 mg/dL — ABNORMAL HIGH (ref 70–99)
Glucose-Capillary: 180 mg/dL — ABNORMAL HIGH (ref 70–99)

## 2020-09-23 LAB — PHOSPHORUS: Phosphorus: 2.5 mg/dL (ref 2.5–4.6)

## 2020-09-23 LAB — MAGNESIUM: Magnesium: 2.3 mg/dL (ref 1.7–2.4)

## 2020-09-23 MED ORDER — VITAL 1.5 CAL PO LIQD
1000.0000 mL | ORAL | Status: DC
  Administered 2020-09-23 – 2020-09-25 (×2): 1000 mL

## 2020-09-23 MED ORDER — METOPROLOL TARTRATE 25 MG/10 ML ORAL SUSPENSION
25.0000 mg | Freq: Two times a day (BID) | ORAL | Status: DC
  Administered 2020-09-23: 25 mg
  Filled 2020-09-23: qty 10

## 2020-09-23 MED ORDER — PROSOURCE TF PO LIQD
45.0000 mL | Freq: Four times a day (QID) | ORAL | Status: DC
  Administered 2020-09-23 – 2020-09-26 (×14): 45 mL
  Filled 2020-09-23 (×14): qty 45

## 2020-09-23 MED ORDER — NICARDIPINE HCL IN NACL 20-0.86 MG/200ML-% IV SOLN
3.0000 mg/h | INTRAVENOUS | Status: DC
  Administered 2020-09-23: 10 mg/h via INTRAVENOUS
  Administered 2020-09-23: 15 mg/h via INTRAVENOUS
  Administered 2020-09-23: 5 mg/h via INTRAVENOUS
  Administered 2020-09-24 (×5): 15 mg/h via INTRAVENOUS
  Filled 2020-09-23 (×11): qty 200

## 2020-09-23 MED ORDER — POTASSIUM CHLORIDE 10 MEQ/50ML IV SOLN
10.0000 meq | INTRAVENOUS | Status: AC
  Administered 2020-09-23 (×4): 10 meq via INTRAVENOUS
  Filled 2020-09-23 (×4): qty 50

## 2020-09-23 MED ORDER — VITAL HIGH PROTEIN PO LIQD: 1000.0000 mL | ORAL | Status: DC

## 2020-09-23 MED ORDER — POTASSIUM CHLORIDE CRYS ER 20 MEQ PO TBCR
40.0000 meq | EXTENDED_RELEASE_TABLET | Freq: Once | ORAL | Status: AC
  Administered 2020-09-23: 40 meq via ORAL
  Filled 2020-09-23: qty 2
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
## 2020-09-23 NOTE — Plan of Care (Signed)
  Problem: Clinical Measurements: Goal: Will remain free from infection Outcome: Progressing Goal: Respiratory complications will improve Outcome: Progressing Goal: Cardiovascular complication will be avoided Outcome: Progressing   Problem: Nutrition: Goal: Adequate nutrition will be maintained Outcome: Progressing   Problem: Coping: Goal: Level of anxiety will decrease Outcome: Progressing   Problem: Elimination: Goal: Will not experience complications related to bowel motility Outcome: Progressing Goal: Will not experience complications related to urinary retention Outcome: Progressing   Problem: Pain Managment: Goal: General experience of comfort will improve Outcome: Progressing   Problem: Safety: Goal: Ability to remain free from injury will improve Outcome: Progressing   Problem: Skin Integrity: Goal: Risk for impaired skin integrity will decrease Outcome: Progressing   Problem: Education: Goal: Knowledge of General Education information will improve Description: Including pain rating scale, medication(s)/side effects and non-pharmacologic comfort measures Outcome: Not Progressing   Problem: Health Behavior/Discharge Planning: Goal: Ability to manage health-related needs will improve Outcome: Not Progressing   Problem: Clinical Measurements: Goal: Ability to maintain clinical measurements within normal limits will improve Outcome: Not Progressing Goal: Diagnostic test results will improve Outcome: Not Progressing   Problem: Activity: Goal: Risk for activity intolerance will decrease Outcome: Not Progressing

## 2020-09-23 NOTE — Progress Notes (Signed)
vLTM EEG complete. No skin breakdown 

## 2020-09-23 NOTE — Progress Notes (Addendum)
eLink Physician-Brief Progress Note Patient Name: Jonathon Patel DOB: 1956-07-22 MRN: [id number]   Date of Service  09/23/2020  HPI/Events of Note  Hypertension - BP = 153/81 and HR = 118 on ceiling dose of Nicardipine.   eICU Interventions  Plan: 1. Metoprolol Suspension 25 mg per tube now and BID. 2. ABG STAT.      Intervention Category Major Interventions: Hypertension - evaluation and management  Lenell Antu 09/23/2020, 11:32 PM

## 2020-09-23 NOTE — Procedures (Addendum)
Patient Name:Jonathon Patel JOI:[id number] Epilepsy Attending:Garth Diffley Annabelle Harman Referring Physician/Provider:Dr Levon Hedger Duration:09/22/2020 1583 to 09/23/2020 0940  Patient history:65 year old man with hx of traumatic seizures, HTN, COVID infection a few weeks ago presenting with OOH PEA arrest.EEG to evaluate for seizure  Level of alertness:comatose  AEDs during EEG study:Propofol  Technical aspects: This EEG study was done with scalp electrodes positioned according to the 10-20 International system of electrode placement. Electrical activity was acquired at a sampling rate of 500Hz  and reviewed with a high frequency filter of 70Hz  and a low frequency filter of 1Hz . EEG data were recorded continuously and digitally stored.   Description:EEGshowed continuous generalized background suppression.EEG was not reactive to tactile stimulation.  ABNORMALITY -Background suppression, generalized  IMPRESSION: This study is suggestive ofprofounddiffuse encephalopathy, nonspecific etiology.No seizures or epileptiform discharges were seen throughout the recording.  [name] 
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
## 2020-09-24 ENCOUNTER — Inpatient Hospital Stay (HOSPITAL_COMMUNITY): Payer: BC Managed Care – PPO

## 2020-09-24 ENCOUNTER — Encounter: Payer: Self-pay | Admitting: Family Medicine

## 2020-09-24 DIAGNOSIS — I469 Cardiac arrest, cause unspecified: Secondary | ICD-10-CM | POA: Diagnosis not present

## 2020-09-24 LAB — POCT I-STAT 7, (LYTES, BLD GAS, ICA,H+H)
Acid-base deficit: 2 mmol/L (ref 0.0–2.0)
Bicarbonate: 20.5 mmol/L (ref 20.0–28.0)
Calcium, Ion: 1.13 mmol/L — ABNORMAL LOW (ref 1.15–1.40)
HCT: 43 % (ref 39.0–52.0)
Hemoglobin: 14.6 g/dL (ref 13.0–17.0)
O2 Saturation: 95 %
Patient temperature: 37.2
Potassium: 3.3 mmol/L — ABNORMAL LOW (ref 3.5–5.1)
Sodium: 142 mmol/L (ref 135–145)
TCO2: 21 mmol/L — ABNORMAL LOW (ref 22–32)
pCO2 arterial: 28.2 mmHg — ABNORMAL LOW (ref 32.0–48.0)
pH, Arterial: 7.471 — ABNORMAL HIGH (ref 7.350–7.450)
pO2, Arterial: 72 mmHg — ABNORMAL LOW (ref 83.0–108.0)

## 2020-09-24 LAB — GLUCOSE, CAPILLARY
Glucose-Capillary: 166 mg/dL — ABNORMAL HIGH (ref 70–99)
Glucose-Capillary: 172 mg/dL — ABNORMAL HIGH (ref 70–99)
Glucose-Capillary: 158 mg/dL — ABNORMAL HIGH (ref 70–99)
Glucose-Capillary: 129 mg/dL — ABNORMAL HIGH (ref 70–99)
Glucose-Capillary: 151 mg/dL — ABNORMAL HIGH (ref 70–99)

## 2020-09-24 LAB — CBC
HCT: 44.4 % (ref 39.0–52.0)
Hemoglobin: 16.3 g/dL (ref 13.0–17.0)
MCH: 32.1 pg (ref 26.0–34.0)
MCHC: 36.7 g/dL — ABNORMAL HIGH (ref 30.0–36.0)
MCV: 87.4 fL (ref 80.0–100.0)
Platelets: 165 10*3/uL (ref 150–400)
RBC: 5.08 MIL/uL (ref 4.22–5.81)
RDW: 14 % (ref 11.5–15.5)
WBC: 17 10*3/uL — ABNORMAL HIGH (ref 4.0–10.5)
nRBC: 0 % (ref 0.0–0.2)

## 2020-09-24 LAB — BASIC METABOLIC PANEL
Anion gap: 11 (ref 5–15)
BUN: 28 mg/dL — ABNORMAL HIGH (ref 8–23)
CO2: 19 mmol/L — ABNORMAL LOW (ref 22–32)
Calcium: 8.3 mg/dL — ABNORMAL LOW (ref 8.9–10.3)
Chloride: 108 mmol/L (ref 98–111)
Creatinine, Ser: 0.89 mg/dL (ref 0.61–1.24)
GFR, Estimated: >60 mL/min (ref >60)
Glucose, Bld: 176 mg/dL — ABNORMAL HIGH (ref 70–99)
Potassium: 3.2 mmol/L — ABNORMAL LOW (ref 3.5–5.1)
Sodium: 138 mmol/L (ref 135–145)

## 2020-09-24 LAB — PHOSPHORUS: Phosphorus: 1.5 mg/dL — ABNORMAL LOW (ref 2.5–4.6)

## 2020-09-24 LAB — MAGNESIUM: Magnesium: 2.4 mg/dL (ref 1.7–2.4)

## 2020-09-24 MED ORDER — POTASSIUM CHLORIDE 20 MEQ PO PACK
60.0000 meq | PACK | Freq: Once | ORAL | Status: AC
  Administered 2020-09-24: 60 meq via ORAL
  Filled 2020-09-24: qty 3

## 2020-09-24 MED ORDER — NOREPINEPHRINE 4 MG/250ML-% IV SOLN
0.0000 ug/min | INTRAVENOUS | Status: DC
  Administered 2020-09-24: 2 ug/min via INTRAVENOUS
  Administered 2020-09-25: 16 ug/min via INTRAVENOUS
  Administered 2020-09-25: 14 ug/min via INTRAVENOUS
  Administered 2020-09-25: 16 ug/min via INTRAVENOUS
  Administered 2020-09-25: 22 ug/min via INTRAVENOUS
  Administered 2020-09-25: 18 ug/min via INTRAVENOUS
  Administered 2020-09-25 – 2020-09-26 (×2): 24 ug/min via INTRAVENOUS
  Administered 2020-09-26: 25 ug/min via INTRAVENOUS
  Administered 2020-09-26: 24 ug/min via INTRAVENOUS
  Filled 2020-09-24 (×11): qty 250

## 2020-09-24 MED ORDER — POTASSIUM CHLORIDE 10 MEQ/50ML IV SOLN
10.0000 meq | INTRAVENOUS | Status: AC
  Administered 2020-09-24 (×4): 10 meq via INTRAVENOUS
  Filled 2020-09-24 (×4): qty 50

## 2020-09-24 MED ORDER — PROPOFOL 1000 MG/100ML IV EMUL
5.0000 ug/kg/min | INTRAVENOUS | Status: DC
  Administered 2020-09-24: 5 ug/kg/min via INTRAVENOUS
  Administered 2020-09-24: 30 ug/kg/min via INTRAVENOUS
  Administered 2020-09-24: 50 ug/kg/min via INTRAVENOUS
  Administered 2020-09-24: 30 ug/kg/min via INTRAVENOUS
  Administered 2020-09-24: 25 ug/kg/min via INTRAVENOUS
  Administered 2020-09-24: 30 ug/kg/min via INTRAVENOUS
  Filled 2020-09-24: qty 100
  Filled 2020-09-24: qty 200
  Filled 2020-09-24 (×2): qty 100

## 2020-09-24 MED ORDER — METOPROLOL TARTRATE 25 MG/10 ML ORAL SUSPENSION
25.0000 mg | Freq: Once | ORAL | Status: AC
  Administered 2020-09-24: 25 mg
  Filled 2020-09-24: qty 10

## 2020-09-24 MED ORDER — ENOXAPARIN SODIUM 40 MG/0.4ML ~~LOC~~ SOLN
40.0000 mg | Freq: Every day | SUBCUTANEOUS | Status: DC
  Administered 2020-09-24: 40 mg via SUBCUTANEOUS
  Filled 2020-09-24: qty 0.4

## 2020-09-24 MED ORDER — METOPROLOL TARTRATE 25 MG/10 ML ORAL SUSPENSION
50.0000 mg | Freq: Two times a day (BID) | ORAL | Status: DC
  Administered 2020-09-24: 50 mg
  Filled 2020-09-24 (×2): qty 20

## 2020-09-24 MED ORDER — POTASSIUM PHOSPHATES 15 MMOLE/5ML IV SOLN
20.0000 mmol | Freq: Once | INTRAVENOUS | Status: AC
  Administered 2020-09-24: 20 mmol via INTRAVENOUS
  Filled 2020-09-24: qty 6.67

## 2020-09-24 MED FILL — Medication: Qty: 1 | Status: AC

## 2020-09-24 NOTE — Progress Notes (Signed)
Tele-visit with wife facilitated by eLink.

## 2020-09-24 NOTE — Progress Notes (Addendum)
Neurology progress note  Subjective: Overnight hypertension did not respond to CCB's but to sedation. Night RN noted decorticate posturing to pain. Patient remains comatose this morning.  Objective: Current vital signs: BP 124/85 (BP Location: Left Arm)   Pulse 80   Temp 97.9 F (36.6 C) (Bladder)   Resp (!) 31   Ht 6' (1.829 m)   Wt 124.4 kg   SpO2 100%   BMI 37.20 kg/m  Vital signs in last 24 hours: Temp:  [97.9 F (36.6 C)-100.2 F (37.9 C)] 97.9 F (36.6 C) (02/22 1200) Pulse Rate:  [80-120] 80 (02/22 1200) Resp:  [22-38] 31 (02/22 1200) BP: (124-171)/(71-106) 124/85 (02/22 1200) SpO2:  [99 %-100 %] 100 % (02/22 1200) Arterial Line BP: (129-205)/(71-104) 129/71 (02/22 1200) FiO2 (%):  [30 %] 30 % (02/22 1200) Weight:  [122.7 kg-124.4 kg] 124.4 kg (02/22 0500)  Physical exam  GENERAL: Breathing over the ventilator. HEENT-pinpoint pupils, no OP obstruction, [place]/AT Cardiovascular-tachycardia, regular rhythm. Lungs-no accessory muscle use Abdomen-soft, no distention. Extremities- Warm, dry and intact  Neurologic Exam: Mental Status: Patient is comatose, GCS 3, breathing over the ventilator today.  Unable to follow any commands.  On sternal rub-elevation in blood pressure but no posturing or grimacing noted. Cranial Nerves: Pinpoint pupil, hypertrophic and mildly disconjugate.  Pupils are equal but not reactive to light or accommodation.  No scleral, corneal oroculocephalic reflex. Motor: No purposeful movement to noxious stimuli Unable to perform sensory/cerebellar/ gait.    Lab Results: Basic Metabolic Panel: Recent Labs  Lab 09/22/20 0107 09/22/20 0405 09/22/20 0754 09/22/20 2105 09/23/20 0603 09/23/20 2356 09/24/20 0427  NA 137  --  138 134* 139 142 138  K 3.3*  --  3.4* 3.0* 3.0* 3.3* 3.2*  CL 108  --  108 104 109  --  108  CO2 15*  --  17* 18* 18*  --  19*  GLUCOSE 172*  --  172* 166* 138*  --  176*  BUN 31*  --  29* 30* 29*  --  28*  CREATININE  1.44*  --  1.38* 1.21 1.15  --  0.89  CALCIUM 9.2  --  9.0 8.4* 8.4*  --  8.3*  MG  --  1.9  --   --  2.3  --  2.4  PHOS  --  2.5  --   --  2.5  --  1.5*   CBC: Recent Labs  Lab 09/11/2020 1007 09/20/2020 1018 09/22/20 0400 09/22/20 0405 09/22/20 1146 09/22/20 2105 09/23/20 0603 09/23/20 2356 09/24/20 0427  WBC 20.1*  --   --  17.0*  --   --  15.8*  --  17.0*  NEUTROABS 14.4*  --   --   --   --   --   --   --   --   HGB 15.5   < >  --  17.4* 17.0 16.4 15.1 14.6 16.3  HCT 44.6   < >  --  47.2 47.5 45.5 42.7 43.0 44.4  MCV 95.9  --   --  86.1  --   --  88.4  --  87.4  PLT 167  --  167 170  --   --  152  --  165   < > = values in this interval not displayed.    Imaging: CT Head wo contrast CT Cervical Spine wo contrast 09/16/2020  IMPRESSION: No acute intracranial abnormality. No acute abnormality cervical spine.Small, remote right frontal infarct.C4-5 and C5-6 degenerative disc  disease Mild sinus disease.  LT EEG 09/22/2020  IMPRESSION: This study is suggestive ofprofounddiffuse encephalopathy, nonspecific etiology.No seizures or epileptiform discharges were seen throughout the recording.  MRI 09/24/2020  Pending  Assessment: 65 year old-year with posttraumatic seizures, COVID infection s/p CPR 8: 30, ROSC 9: 09, CPR 9: 19, ROSC 9: 24, arrived to ER 9: 33.  S/p 7 rounds of epinephrine, nausea, no other meds.  Neurology is following for post cardiac arrest prognostication.   Current presentation most likely secondary to anoxic brain injury. -Restarted on propofol this morning. -60 hours since rewarming. -Neurological exam shows comatose patient breathing on ventilator, pinpoint non rea tive pupils hypertrophic right eye (disconjugate gaze).  No corneal, scleral, oculocephalic, cough reflexes.  No purposeful movement to noxious stimuli. -CT scan on 09/16/2020 does not show any acute intracranial abnormality -CT cervical spine does not show any acute abnormality. -MRI  pending -EEG does not show any seizure or epileptiform discharges-generalized background suppression. This EEG finding of background suppression without sedation does not look favorable for a neurological meaningful recovery 5 since he was on hypothermia protocol, it would be prudent to wait 72 hours prior to providing reliable prognostication.  Will evaluate tomorrow morning.  Recommendation: -Wean off sedation, okay to use as needed fentanyl for MRI. -Frequent neuro checks -Family meeting for tomorrow. Exam remains dismal for chances of meaningful neurological recovery. -Will follow  Arnoldo Lenis, MD PGY-1, Resident  09/24/2020, 12:30 PM    Attending Neurohospitalist Addendum Patient seen and examined with APP/Resident. Agree with the history and physical as documented above. Agree with the plan as documented, which I helped formulate. I have independently reviewed the chart, obtained history, review of systems and examined the patient.I have personally reviewed pertinent head/neck/spine imaging (CT/MRI). Please feel free to call with any questions. --- Milon Dikes, MD [facility] Pager: [phone]  If 7pm to 7am, please call on call as listed on AMION.

## 2020-09-24 NOTE — Progress Notes (Addendum)
eLink Physician-Brief Progress Note Patient Name: Jonathon Patel DOB: 1956/06/03 MRN: [id number]   Date of Service  09/24/2020  HPI/Events of Note  Ventilator Asynchrony - Mental status has not improved with sedation holiday.Nursing reports posturing with sternal rub. MRI pending for today.   eICU Interventions  Plan: 1. Restart Fentanyl IV infusion. 2. Portable CXR STAT.      Intervention Category Major Interventions: Respiratory failure - evaluation and management  Lenell Antu 09/24/2020, 6:38 AM

## 2020-09-24 NOTE — Progress Notes (Signed)
eLink Physician-Brief Progress Note Patient Name: Jonathon Patel DOB: [birth year] MRN: [id number]   Date of Service  09/24/2020  HPI/Events of Note  Hypertension - BP = 170/83 and HR = 105.   eICU Interventions  Plan: 1. Metoprolol 25 mg per tube now (extra dose). 2. Increase Metoprolol to 50 mg per tube BID.     Intervention Category Major Interventions: Hypertension - evaluation and management  Sedrick Tober Dennard Nip 09/24/2020, 4:34 AM

## 2020-09-24 NOTE — Plan of Care (Signed)
  Problem: Clinical Measurements: Goal: Will remain free from infection Outcome: Progressing Goal: Respiratory complications will improve Outcome: Progressing Goal: Cardiovascular complication will be avoided Outcome: Progressing   Problem: Nutrition: Goal: Adequate nutrition will be maintained Outcome: Progressing   Problem: Coping: Goal: Level of anxiety will decrease Outcome: Progressing   Problem: Elimination: Goal: Will not experience complications related to bowel motility Outcome: Progressing Goal: Will not experience complications related to urinary retention Outcome: Progressing   Problem: Safety: Goal: Ability to remain free from injury will improve Outcome: Progressing   Problem: Skin Integrity: Goal: Risk for impaired skin integrity will decrease Outcome: Progressing   Problem: Education: Goal: Knowledge of General Education information will improve Description: Including pain rating scale, medication(s)/side effects and non-pharmacologic comfort measures Outcome: Not Progressing   Problem: Health Behavior/Discharge Planning: Goal: Ability to manage health-related needs will improve Outcome: Not Progressing   Problem: Clinical Measurements: Goal: Ability to maintain clinical measurements within normal limits will improve Outcome: Not Progressing Goal: Diagnostic test results will improve Outcome: Not Progressing   Problem: Activity: Goal: Risk for activity intolerance will decrease Outcome: Not Progressing   Problem: Pain Managment: Goal: General experience of comfort will improve Outcome: Not Progressing
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
## 2020-09-24 NOTE — Progress Notes (Signed)
eLink Physician-Brief Progress Note Patient Name: Jonathon Patel DOB: 1956-05-03 MRN: [id number]   Date of Service  09/24/2020  HPI/Events of Note  Notified of hypotension with BP 85/48, MAP 59.  Propofol discontinued 40 minutes prior. Pt is post out of hospital arrest with anoxic brain injury.   eICU Interventions  Start on levophed.  Discontinue metoprolol.     Intervention Category Intermediate Interventions: Hypotension - evaluation and management  Larinda Buttery 09/24/2020, 10:15 PM

## 2020-09-24 NOTE — Progress Notes (Signed)
eLink Physician-Brief Progress Note Patient Name: Jonathon Patel DOB: [birth year] MRN: [id number]   Date of Service  09/24/2020  HPI/Events of Note  ABG on 30%/PRVC 31/TV 510/P 5 = 7.47/28.2/72   eICU Interventions  Continue present ventilator management.     Intervention Category Major Interventions: Respiratory failure - evaluation and management  [name] 09/24/2020, 12:42 AM

## 2020-09-24 NOTE — Progress Notes (Signed)
Assisted tele visit to patient with wife.  Jonathon Schwinn Anderson, RN   

## 2020-09-25 ENCOUNTER — Encounter (HOSPITAL_COMMUNITY): Payer: Self-pay | Admitting: Internal Medicine

## 2020-09-25 DIAGNOSIS — I469 Cardiac arrest, cause unspecified: Secondary | ICD-10-CM | POA: Diagnosis not present

## 2020-09-25 LAB — BPAM RBC
Blood Product Expiration Date: 202203032359
Blood Product Expiration Date: 202203032359
Blood Product Expiration Date: 202203212359
Blood Product Expiration Date: 202203212359
Blood Product Expiration Date: 202203212359
Blood Product Expiration Date: 202203212359
Blood Product Expiration Date: 202203212359
Blood Product Expiration Date: 202203212359
Blood Product Expiration Date: 202203212359
Blood Product Expiration Date: 202203222359
Blood Product Expiration Date: 202203222359
Blood Product Expiration Date: 202203222359
ISSUE DATE / TIME: 202202191824
ISSUE DATE / TIME: 202202191824
ISSUE DATE / TIME: 202202191824
ISSUE DATE / TIME: 202202191824
ISSUE DATE / TIME: 202202191838
ISSUE DATE / TIME: 202202191838
ISSUE DATE / TIME: 202202191838
ISSUE DATE / TIME: 202202191838
ISSUE DATE / TIME: 202202191923
ISSUE DATE / TIME: 202202191923
ISSUE DATE / TIME: 202202200954
ISSUE DATE / TIME: 202202201054
Unit Type and Rh: 5100
Unit Type and Rh: 5100
Unit Type and Rh: 5100
Unit Type and Rh: 5100
Unit Type and Rh: 5100
Unit Type and Rh: 5100
Unit Type and Rh: 5100
Unit Type and Rh: 5100
Unit Type and Rh: 5100
Unit Type and Rh: 5100
Unit Type and Rh: 5100
Unit Type and Rh: 5100

## 2020-09-25 LAB — TYPE AND SCREEN
ABO/RH(D): O POS
Antibody Screen: NEGATIVE
Unit division: 0
Unit division: 0
Unit division: 0
Unit division: 0
Unit division: 0
Unit division: 0
Unit division: 0
Unit division: 0
Unit division: 0
Unit division: 0
Unit division: 0
Unit division: 0

## 2020-09-25 LAB — CBC
HCT: 43.3 % (ref 39.0–52.0)
Hemoglobin: 15.2 g/dL (ref 13.0–17.0)
MCH: 31.7 pg (ref 26.0–34.0)
MCHC: 35.1 g/dL (ref 30.0–36.0)
MCV: 90.4 fL (ref 80.0–100.0)
Platelets: 209 10*3/uL (ref 150–400)
RBC: 4.79 MIL/uL (ref 4.22–5.81)
RDW: 14.5 % (ref 11.5–15.5)
WBC: 15.7 10*3/uL — ABNORMAL HIGH (ref 4.0–10.5)
nRBC: 0 % (ref 0.0–0.2)

## 2020-09-25 LAB — BASIC METABOLIC PANEL
Anion gap: 13 (ref 5–15)
BUN: 25 mg/dL — ABNORMAL HIGH (ref 8–23)
CO2: 21 mmol/L — ABNORMAL LOW (ref 22–32)
Calcium: 8.2 mg/dL — ABNORMAL LOW (ref 8.9–10.3)
Chloride: 108 mmol/L (ref 98–111)
Creatinine, Ser: 1.03 mg/dL (ref 0.61–1.24)
GFR, Estimated: >60 mL/min (ref >60)
Glucose, Bld: 161 mg/dL — ABNORMAL HIGH (ref 70–99)
Potassium: 2.9 mmol/L — ABNORMAL LOW (ref 3.5–5.1)
Sodium: 142 mmol/L (ref 135–145)

## 2020-09-25 LAB — GLUCOSE, CAPILLARY
Glucose-Capillary: 146 mg/dL — ABNORMAL HIGH (ref 70–99)
Glucose-Capillary: 156 mg/dL — ABNORMAL HIGH (ref 70–99)
Glucose-Capillary: 160 mg/dL — ABNORMAL HIGH (ref 70–99)
Glucose-Capillary: 166 mg/dL — ABNORMAL HIGH (ref 70–99)
Glucose-Capillary: 171 mg/dL — ABNORMAL HIGH (ref 70–99)
Glucose-Capillary: 179 mg/dL — ABNORMAL HIGH (ref 70–99)
Glucose-Capillary: 189 mg/dL — ABNORMAL HIGH (ref 70–99)

## 2020-09-25 LAB — SODIUM
Sodium: 146 mmol/L — ABNORMAL HIGH (ref 135–145)
Sodium: 147 mmol/L — ABNORMAL HIGH (ref 135–145)
Sodium: 147 mmol/L — ABNORMAL HIGH (ref 135–145)
Sodium: 146 mmol/L — ABNORMAL HIGH (ref 135–145)

## 2020-09-25 LAB — PHOSPHORUS: Phosphorus: 2.9 mg/dL (ref 2.5–4.6)

## 2020-09-25 LAB — TRIGLYCERIDES: Triglycerides: 713 mg/dL — ABNORMAL HIGH (ref <150)

## 2020-09-25 LAB — MAGNESIUM: Magnesium: 2.5 mg/dL — ABNORMAL HIGH (ref 1.7–2.4)

## 2020-09-25 MED ORDER — POTASSIUM CHLORIDE CRYS ER 20 MEQ PO TBCR
40.0000 meq | EXTENDED_RELEASE_TABLET | Freq: Once | ORAL | Status: AC
  Administered 2020-09-25: 40 meq via ORAL
  Filled 2020-09-25: qty 2

## 2020-09-25 MED ORDER — POTASSIUM CHLORIDE 10 MEQ/50ML IV SOLN
10.0000 meq | INTRAVENOUS | Status: AC
  Administered 2020-09-25 (×4): 10 meq via INTRAVENOUS
  Filled 2020-09-25 (×4): qty 50

## 2020-09-25 MED ORDER — DESMOPRESSIN ACETATE SPRAY 0.01 % NA SOLN
10.0000 ug | Freq: Two times a day (BID) | NASAL | Status: DC
  Administered 2020-09-25 – 2020-09-26 (×3): 10 ug via NASAL
  Filled 2020-09-25 (×2): qty 5

## 2020-09-25 MED ORDER — POTASSIUM CHLORIDE 2 MEQ/ML IV SOLN
INTRAVENOUS | Status: DC
  Filled 2020-09-25 (×5): qty 1000

## 2020-09-25 MED ORDER — POTASSIUM CHLORIDE 20 MEQ PO PACK
40.0000 meq | PACK | Freq: Once | ORAL | Status: AC
  Administered 2020-09-25: 40 meq
  Filled 2020-09-25: qty 2

## 2020-09-25 MED ORDER — DESMOPRESSIN ACETATE SPRAY 0.01 % NA SOLN: 10.0000 ug | Freq: Two times a day (BID) | NASAL | Status: DC

## 2020-09-25 NOTE — Progress Notes (Signed)
Pt has periods of BP spikes. No stimulation of patient. BP then returns to baseline with no intervention.     09/25/20 2300  Vitals  BP (!) 199/79  MAP (mmHg) 112  Pulse Rate 97  ECG Heart Rate 98  Resp (!) 31  Oxygen Therapy  SpO2 98 %  Art Line  Arterial Line BP 231/126  Arterial Line MAP (mmHg) 172 mmHg  MEWS Score  MEWS Temp 0  MEWS Systolic 0  MEWS Pulse 0  MEWS RR 2  MEWS LOC 3  MEWS Score 5  MEWS Score Color Red

## 2020-09-25 NOTE — Progress Notes (Signed)
   09/25/20 1100  Clinical Encounter Type  Visited With Patient;Family  Visit Type Critical Care  Referral From Nurse  Consult/Referral To Chaplain  The chaplain visited with the family per the request of the nurse Alphonzo Lemmings). The family is Catholic. The chaplain called and spoke to Father Ree Kida, and he will visit the patient and family. The family is not connected to a local parish but expressed affiliation in [place], [place]. The chaplain will follow up as needed.

## 2020-09-25 NOTE — Progress Notes (Signed)
[facility] ADULT ICU REPLACEMENT PROTOCOL   The patient does apply for the [facility] Adult ICU Electrolyte Replacment Protocol based on the criteria listed below:   1. Is GFR >/= 30 ml/min? Yes.    Patient's GFR today is >60 . Is SCr </= 2? No. Patient's SCr is 0.89 ml/kg/hr 3. Did SCr increase >/= 0.5 in 24 hours? No. 4. Abnormal electrolyte(s): K+ (2.9,) 6. If a panic level lab has been reported, has the CCM MD in charge been notified?N/A  Danne Harbor 09/25/2020 6:03 AM

## 2020-09-25 NOTE — Progress Notes (Addendum)
Neurology progress note   Subjective: Patient's was hypotensive overnight and their was reoccurrence of melena. Off propofol since midnight. Started on levophed due to labile pressures. Patient remains comatose this morning.  Objective: Current vital signs: BP 118/71   Pulse 82   Temp 98.6 F (37 C) (Bladder)   Resp (!) 31   Ht 6' (1.829 m)   Wt 125.5 kg   SpO2 99%   BMI 37.52 kg/m  Vital signs in last 24 hours: Temp:  [97.9 F (36.6 C)-98.8 F (37.1 C)] 98.6 F (37 C) (02/23 0800) Pulse Rate:  [70-136] 82 (02/23 1045) Resp:  [24-32] 31 (02/23 1045) BP: (87-184)/(61-125) 118/71 (02/23 1030) SpO2:  [97 %-100 %] 99 % (02/23 1045) Arterial Line BP: (77-191)/(46-118) 117/69 (02/23 1045) FiO2 (%):  [30 %] 30 % (02/23 0800) Weight:  [125.5 kg] 125.5 kg (02/23 0416)  Physical exam  GENERAL: Breathing with the ventilator. HEENT-Fixed, dilated pupils 69mm Cardiovascular-Regular rate and rhythm  Lungs-no accessory muscle use Abdomen-soft, no distention. Extremities- Warm, dry and intact  Neurologic Exam: Mental Status: Patient is comatose, GCS 3, not breathing over the ventilator.  Unable to follow any commands.  No response to sternal rub Cranial Nerves: Dilated, non-reactive fixed pupils 7 mm with mildly disconjugate gaze. No scleral, corneal oroculocephalic reflex.  No cough or gag. Motor: No spontaneous movement.  No response to noxious stimulation in upper extremities.  Extension in both lower extremities to extreme noxious stimulation.  Lab Results: Basic Metabolic Panel: Recent Labs  Lab 09/22/20 0405 09/22/20 0754 09/22/20 2105 09/23/20 0603 09/23/20 2356 09/24/20 0427 09/25/20 0329  NA  --  138 134* 139 142 138 142  K  --  3.4* 3.0* 3.0* 3.3* 3.2* 2.9*  CL  --  108 104 109  --  108 108  CO2  --  17* 18* 18*  --  19* 21*  GLUCOSE  --  172* 166* 138*  --  176* 161*  BUN  --  29* 30* 29*  --  28* 25*   CREATININE  --  1.38* 1.21 1.15  --  0.89 1.03  CALCIUM  --  9.0 8.4* 8.4*  --  8.3* 8.2*  MG 1.9  --   --  2.3  --  2.4 2.5*  PHOS 2.5  --   --  2.5  --  1.5* 2.9   CBC: Recent Labs  Lab 09/14/2020 1007 09/27/2020 1018 09/22/20 0400 09/22/20 0405 09/22/20 1146 09/22/20 2105 09/23/20 0603 09/23/20 2356 09/24/20 0427 09/25/20 0329  WBC 20.1*  --   --  17.0*  --   --  15.8*  --  17.0* 15.7*  NEUTROABS 14.4*  --   --   --   --   --   --   --   --   --   HGB 15.5   < >  --  17.4*   < > 16.4 15.1 14.6 16.3 15.2  HCT 44.6   < >  --  47.2   < > 45.5 42.7 43.0 44.4 43.3  MCV 95.9  --   --  86.1  --   --  88.4  --  87.4 90.4  PLT 167  --  167 170  --   --  152  --  165 209   < > = values in this interval not displayed.   Imaging: CT Head wo contrast CT Cervical Spine wo contrast 09/27/2020  IMPRESSION: No acute intracranial abnormality. No acute abnormality  cervical spine.Small, remote right frontal infarct.C4-5 and C5-6 degenerative disc disease Mild sinus disease.  LT EEG 09/22/2020  IMPRESSION: This study is suggestive ofprofounddiffuse encephalopathy, nonspecific etiology.No seizures or epileptiform discharges were seen throughout the recording.  MRI 09/24/2020  IMPRESSION: 1. Findings consistent with severe global hypoxic/ischemic injury, as detailed above. Resulting diffuse edema with sulcal effacement and downward herniation with effacement of the suprasellar cistern, basal cisterns and downward tonsillar herniation with posterior fossa crowding. 2. 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  Assessment: 65 year old-year with posttraumatic seizures, COVID infection s/p CPR 8: 67, ROSC 9: 09, CPR 9: 19, ROSC 9: 24, arrived to ER 9: 33.  S/p 7 rounds of epinephrine, nausea, no other meds.  Neurology is following for post cardiac arrest prognostication.   Current presentation secondary to anoxic brain injury. -Off propofol  since midnight. -~80 hours since rewarming.  Not on sedation for at least more than 24 hours. -Neurological exam shows comatose patient breathing on ventilator, fixed, non-reactive, dilated pupils, hypertrophic right eye (disconjugate gaze).  No corneal, scleral, oculocephalic, cough reflexes.   No significant evidence of cortical function.  -MRI shows severe global hypoxic/ischemic injury.  Diffuse edema with sulcal effacement and downward herniation with effacement of the suprasellar cistern, basal cisterns and downward tonsillar herniation with posterior fossa crowding.  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 -EEG does not show any seizure or epileptiform discharges-generalized background suppression.   Recommendation: -We will likely progress to brain death.  Family meeting with family  -Other recommendations as per supervising attending Dr. Rory Percy.   Honor Junes, MD PGY-1, Resident  09/25/2020, 10:52 AM   Attending addendum Agree with history and physical above Exam with no evidence of brainstem reflexes or higher cortical function today. Discussed this with the ICU team and met with the family and explained to them the clinical findings as well as discussed the MRI findings suggestive of global anoxic brain injury.  He is unlikely to survive this insult. Family understands that he would progress to brain death. Further conversations about withdrawal of care per PCCM. Neurology services will be available as needed Please call with questions.  -- Amie Portland, MD Neurologist [facility] Pager: [phone]  CRITICAL CARE ATTESTATION Performed by: Amie Portland, MD Total critical care time: 50 minutes Critical care time was exclusive of separately billable procedures and treating other patients and/or supervising APPs/Residents/Students Critical care was necessary to treat or prevent imminent or life-threatening deterioration  due to anoxic brain injury This patient is critically ill and at significant risk for neurological worsening and/or death and care requires constant monitoring. Critical care was time spent personally by me on the following activities: development of treatment plan with patient and/or surrogate as well as nursing, discussions with consultants, evaluation of patient's response to treatment, examination of patient, obtaining history from patient or surrogate, ordering and performing treatments and interventions, ordering and review of laboratory studies, ordering and review of radiographic studies, pulse oximetry, re-evaluation of patient's condition, participation in multidisciplinary rounds and medical decision making of high complexity in the care of this patient.
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
## 2020-09-26 DIAGNOSIS — I469 Cardiac arrest, cause unspecified: Secondary | ICD-10-CM | POA: Diagnosis not present

## 2020-09-26 LAB — POCT I-STAT 7, (LYTES, BLD GAS, ICA,H+H)
Acid-base deficit: 8 mmol/L — ABNORMAL HIGH (ref 0.0–2.0)
Acid-base deficit: 9 mmol/L — ABNORMAL HIGH (ref 0.0–2.0)
Bicarbonate: 16.2 mmol/L — ABNORMAL LOW (ref 20.0–28.0)
Bicarbonate: 24.2 mmol/L (ref 20.0–28.0)
Calcium, Ion: 1.1 mmol/L — ABNORMAL LOW (ref 1.15–1.40)
Calcium, Ion: 1.21 mmol/L (ref 1.15–1.40)
HCT: 41 % (ref 39.0–52.0)
HCT: 46 % (ref 39.0–52.0)
Hemoglobin: 13.9 g/dL (ref 13.0–17.0)
Hemoglobin: 15.6 g/dL (ref 13.0–17.0)
O2 Saturation: 93 %
O2 Saturation: 93 %
Patient temperature: 37.1
Patient temperature: 37.1
Potassium: 3.6 mmol/L (ref 3.5–5.1)
Potassium: 4.3 mmol/L (ref 3.5–5.1)
Sodium: 146 mmol/L — ABNORMAL HIGH (ref 135–145)
Sodium: 147 mmol/L — ABNORMAL HIGH (ref 135–145)
TCO2: 17 mmol/L — ABNORMAL LOW (ref 22–32)
TCO2: 27 mmol/L (ref 22–32)
pCO2 arterial: 31.7 mmHg — ABNORMAL LOW (ref 32.0–48.0)
pCO2 arterial: 80.6 mmHg (ref 32.0–48.0)
pH, Arterial: 7.086 — CL (ref 7.350–7.450)
pH, Arterial: 7.318 — ABNORMAL LOW (ref 7.350–7.450)
pO2, Arterial: 72 mmHg — ABNORMAL LOW (ref 83.0–108.0)
pO2, Arterial: 93 mmHg (ref 83.0–108.0)

## 2020-09-26 LAB — BASIC METABOLIC PANEL
Anion gap: 9 (ref 5–15)
BUN: 21 mg/dL (ref 8–23)
CO2: 17 mmol/L — ABNORMAL LOW (ref 22–32)
Calcium: 8.3 mg/dL — ABNORMAL LOW (ref 8.9–10.3)
Chloride: 117 mmol/L — ABNORMAL HIGH (ref 98–111)
Creatinine, Ser: 1.09 mg/dL (ref 0.61–1.24)
GFR, Estimated: >60 mL/min (ref >60)
Glucose, Bld: 221 mg/dL — ABNORMAL HIGH (ref 70–99)
Potassium: 4 mmol/L (ref 3.5–5.1)
Sodium: 143 mmol/L (ref 135–145)

## 2020-09-26 LAB — CBC
HCT: 44.6 % (ref 39.0–52.0)
Hemoglobin: 15.2 g/dL (ref 13.0–17.0)
MCH: 31.6 pg (ref 26.0–34.0)
MCHC: 34.1 g/dL (ref 30.0–36.0)
MCV: 92.7 fL (ref 80.0–100.0)
Platelets: 186 10*3/uL (ref 150–400)
RBC: 4.81 MIL/uL (ref 4.22–5.81)
RDW: 14.9 % (ref 11.5–15.5)
WBC: 14.8 10*3/uL — ABNORMAL HIGH (ref 4.0–10.5)
nRBC: 0 % (ref 0.0–0.2)

## 2020-09-26 LAB — SODIUM
Sodium: 144 mmol/L (ref 135–145)
Sodium: 143 mmol/L (ref 135–145)
Sodium: 142 mmol/L (ref 135–145)

## 2020-09-26 LAB — GLUCOSE, CAPILLARY
Glucose-Capillary: 169 mg/dL — ABNORMAL HIGH (ref 70–99)
Glucose-Capillary: 212 mg/dL — ABNORMAL HIGH (ref 70–99)
Glucose-Capillary: 153 mg/dL — ABNORMAL HIGH (ref 70–99)
Glucose-Capillary: 192 mg/dL — ABNORMAL HIGH (ref 70–99)
Glucose-Capillary: 161 mg/dL — ABNORMAL HIGH (ref 70–99)

## 2020-09-26 LAB — MAGNESIUM: Magnesium: 2.4 mg/dL (ref 1.7–2.4)

## 2020-09-26 LAB — PHOSPHORUS: Phosphorus: 2.3 mg/dL — ABNORMAL LOW (ref 2.5–4.6)

## 2020-09-26 MED ORDER — SODIUM CHLORIDE 0.9 % IV SOLN
2000.0000 mg | Freq: Once | INTRAVENOUS | Status: AC
  Administered 2020-09-27: 2000 mg via INTRAVENOUS
  Filled 2020-09-26 (×2): qty 16

## 2020-09-26 MED ORDER — INSULIN ASPART 100 UNIT/ML ~~LOC~~ SOLN
20.0000 [IU] | Freq: Once | SUBCUTANEOUS | Status: AC
  Administered 2020-09-27: 20 [IU] via SUBCUTANEOUS

## 2020-09-26 MED ORDER — DEXTROSE 50 % IV SOLN
50.0000 mL | Freq: Once | INTRAVENOUS | Status: AC
  Administered 2020-09-27: 50 mL via INTRAVENOUS
  Filled 2020-09-26: qty 50

## 2020-09-26 MED ORDER — VANCOMYCIN HCL 1500 MG/300ML IV SOLN
1500.0000 mg | INTRAVENOUS | Status: AC
  Administered 2020-09-27: 1500 mg via INTRAVENOUS
  Filled 2020-09-26: qty 300

## 2020-09-26 MED ORDER — VASOPRESSIN 20 UNITS/100 ML INFUSION FOR SHOCK: 0.0000 [IU]/min | INTRAVENOUS | Status: DC

## 2020-09-26 MED ORDER — SODIUM CHLORIDE 0.9 % IV SOLN
10.0000 ug/h | INTRAVENOUS | Status: DC
  Administered 2020-09-27 – 2020-09-28 (×4): 20 ug/h via INTRAVENOUS
  Filled 2020-09-26: qty 10
  Filled 2020-09-26: qty 5
  Filled 2020-09-26 (×4): qty 10

## 2020-09-26 MED ORDER — EPINEPHRINE HCL 5 MG/250ML IV SOLN IN NS
0.5000 ug/min | INTRAVENOUS | Status: DC
  Administered 2020-09-26: 20:00:00 0.5 ug/min via INTRAVENOUS
  Administered 2020-09-27: 16:00:00 3 ug/min via INTRAVENOUS
  Filled 2020-09-26 (×4): qty 250

## 2020-09-26 MED ORDER — NOREPINEPHRINE 16 MG/250ML-% IV SOLN
0.0000 ug/min | INTRAVENOUS | Status: DC
  Administered 2020-09-26: 40 ug/min via INTRAVENOUS
  Administered 2020-09-26: 25 ug/min via INTRAVENOUS
  Administered 2020-09-27: 39 ug/min via INTRAVENOUS
  Filled 2020-09-26 (×4): qty 250

## 2020-09-26 MED ORDER — LEVOTHYROXINE SODIUM 100 MCG/5ML IV SOLN
20.0000 ug | Freq: Once | INTRAVENOUS | Status: AC
  Administered 2020-09-27: 20 ug via INTRAVENOUS
  Filled 2020-09-26: qty 5

## 2020-09-27 ENCOUNTER — Other Ambulatory Visit (HOSPITAL_COMMUNITY): Payer: BC Managed Care – PPO

## 2020-09-27 DIAGNOSIS — R600 Localized edema: Secondary | ICD-10-CM | POA: Diagnosis not present

## 2020-09-27 DIAGNOSIS — Z005 Encounter for examination of potential donor of organ and tissue: Secondary | ICD-10-CM | POA: Diagnosis not present

## 2020-09-27 DIAGNOSIS — Z4682 Encounter for fitting and adjustment of non-vascular catheter: Secondary | ICD-10-CM | POA: Diagnosis not present

## 2020-09-27 LAB — COMPREHENSIVE METABOLIC PANEL
ALT: 43 U/L (ref 0–44)
ALT: 42 U/L (ref 0–44)
ALT: 38 U/L (ref 0–44)
ALT: 35 U/L (ref 0–44)
AST: 30 U/L (ref 15–41)
AST: 25 U/L (ref 15–41)
AST: 21 U/L (ref 15–41)
AST: 20 U/L (ref 15–41)
Albumin: 2 g/dL — ABNORMAL LOW (ref 3.5–5.0)
Albumin: 1.9 g/dL — ABNORMAL LOW (ref 3.5–5.0)
Albumin: 1.8 g/dL — ABNORMAL LOW (ref 3.5–5.0)
Albumin: 1.8 g/dL — ABNORMAL LOW (ref 3.5–5.0)
Alkaline Phosphatase: 155 U/L — ABNORMAL HIGH (ref 38–126)
Alkaline Phosphatase: 146 U/L — ABNORMAL HIGH (ref 38–126)
Alkaline Phosphatase: 135 U/L — ABNORMAL HIGH (ref 38–126)
Alkaline Phosphatase: 135 U/L — ABNORMAL HIGH (ref 38–126)
Anion gap: 9 (ref 5–15)
Anion gap: 13 (ref 5–15)
Anion gap: 12 (ref 5–15)
Anion gap: 12 (ref 5–15)
BUN: 25 mg/dL — ABNORMAL HIGH (ref 8–23)
BUN: 25 mg/dL — ABNORMAL HIGH (ref 8–23)
BUN: 29 mg/dL — ABNORMAL HIGH (ref 8–23)
BUN: 34 mg/dL — ABNORMAL HIGH (ref 8–23)
CO2: 19 mmol/L — ABNORMAL LOW (ref 22–32)
CO2: 18 mmol/L — ABNORMAL LOW (ref 22–32)
CO2: 17 mmol/L — ABNORMAL LOW (ref 22–32)
CO2: 19 mmol/L — ABNORMAL LOW (ref 22–32)
Calcium: 8.2 mg/dL — ABNORMAL LOW (ref 8.9–10.3)
Calcium: 8.1 mg/dL — ABNORMAL LOW (ref 8.9–10.3)
Calcium: 7.6 mg/dL — ABNORMAL LOW (ref 8.9–10.3)
Calcium: 7.6 mg/dL — ABNORMAL LOW (ref 8.9–10.3)
Chloride: 113 mmol/L — ABNORMAL HIGH (ref 98–111)
Chloride: 111 mmol/L (ref 98–111)
Chloride: 110 mmol/L (ref 98–111)
Chloride: 108 mmol/L (ref 98–111)
Creatinine, Ser: 1.4 mg/dL — ABNORMAL HIGH (ref 0.61–1.24)
Creatinine, Ser: 1.42 mg/dL — ABNORMAL HIGH (ref 0.61–1.24)
Creatinine, Ser: 1.63 mg/dL — ABNORMAL HIGH (ref 0.61–1.24)
Creatinine, Ser: 1.67 mg/dL — ABNORMAL HIGH (ref 0.61–1.24)
GFR, Estimated: 57 mL/min — ABNORMAL LOW (ref >60)
GFR, Estimated: 56 mL/min — ABNORMAL LOW (ref >60)
GFR, Estimated: 47 mL/min — ABNORMAL LOW (ref >60)
GFR, Estimated: 46 mL/min — ABNORMAL LOW (ref >60)
Glucose, Bld: 185 mg/dL — ABNORMAL HIGH (ref 70–99)
Glucose, Bld: 205 mg/dL — ABNORMAL HIGH (ref 70–99)
Glucose, Bld: 390 mg/dL — ABNORMAL HIGH (ref 70–99)
Glucose, Bld: 419 mg/dL — ABNORMAL HIGH (ref 70–99)
Potassium: 5 mmol/L (ref 3.5–5.1)
Potassium: 4.9 mmol/L (ref 3.5–5.1)
Potassium: 6.1 mmol/L — ABNORMAL HIGH (ref 3.5–5.1)
Potassium: 5.3 mmol/L — ABNORMAL HIGH (ref 3.5–5.1)
Sodium: 141 mmol/L (ref 135–145)
Sodium: 142 mmol/L (ref 135–145)
Sodium: 139 mmol/L (ref 135–145)
Sodium: 139 mmol/L (ref 135–145)
Total Bilirubin: 1.5 mg/dL — ABNORMAL HIGH (ref 0.3–1.2)
Total Bilirubin: 1.9 mg/dL — ABNORMAL HIGH (ref 0.3–1.2)
Total Bilirubin: 1.2 mg/dL (ref 0.3–1.2)
Total Bilirubin: 1.4 mg/dL — ABNORMAL HIGH (ref 0.3–1.2)
Total Protein: 5.8 g/dL — ABNORMAL LOW (ref 6.5–8.1)
Total Protein: 5.9 g/dL — ABNORMAL LOW (ref 6.5–8.1)
Total Protein: 5.5 g/dL — ABNORMAL LOW (ref 6.5–8.1)
Total Protein: 5.7 g/dL — ABNORMAL LOW (ref 6.5–8.1)

## 2020-09-27 LAB — URINALYSIS, ROUTINE W REFLEX MICROSCOPIC
Bilirubin Urine: NEGATIVE
Glucose, UA: 150 mg/dL — AB
Hgb urine dipstick: NEGATIVE
Ketones, ur: NEGATIVE mg/dL
Leukocytes,Ua: NEGATIVE
Nitrite: NEGATIVE
Protein, ur: 30 mg/dL — AB
Specific Gravity, Urine: 1.021 (ref 1.005–1.030)
pH: 5 (ref 5.0–8.0)

## 2020-09-27 LAB — POCT I-STAT 7, (LYTES, BLD GAS, ICA,H+H)
Acid-base deficit: 9 mmol/L — ABNORMAL HIGH (ref 0.0–2.0)
Acid-base deficit: 8 mmol/L — ABNORMAL HIGH (ref 0.0–2.0)
Acid-base deficit: 7 mmol/L — ABNORMAL HIGH (ref 0.0–2.0)
Bicarbonate: 18.6 mmol/L — ABNORMAL LOW (ref 20.0–28.0)
Bicarbonate: 18.5 mmol/L — ABNORMAL LOW (ref 20.0–28.0)
Bicarbonate: 20.2 mmol/L (ref 20.0–28.0)
Calcium, Ion: 1.19 mmol/L (ref 1.15–1.40)
Calcium, Ion: 1.12 mmol/L — ABNORMAL LOW (ref 1.15–1.40)
Calcium, Ion: 1.12 mmol/L — ABNORMAL LOW (ref 1.15–1.40)
HCT: 46 % (ref 39.0–52.0)
HCT: 41 % (ref 39.0–52.0)
HCT: 40 % (ref 39.0–52.0)
Hemoglobin: 15.6 g/dL (ref 13.0–17.0)
Hemoglobin: 13.9 g/dL (ref 13.0–17.0)
Hemoglobin: 13.6 g/dL (ref 13.0–17.0)
O2 Saturation: 83 %
O2 Saturation: 88 %
O2 Saturation: 91 %
Patient temperature: 37
Patient temperature: 36.9
Patient temperature: 36.7
Potassium: 5.2 mmol/L — ABNORMAL HIGH (ref 3.5–5.1)
Potassium: 5.8 mmol/L — ABNORMAL HIGH (ref 3.5–5.1)
Potassium: 5.9 mmol/L — ABNORMAL HIGH (ref 3.5–5.1)
Sodium: 143 mmol/L (ref 135–145)
Sodium: 141 mmol/L (ref 135–145)
Sodium: 141 mmol/L (ref 135–145)
TCO2: 20 mmol/L — ABNORMAL LOW (ref 22–32)
TCO2: 20 mmol/L — ABNORMAL LOW (ref 22–32)
TCO2: 22 mmol/L (ref 22–32)
pCO2 arterial: 43.8 mmHg (ref 32.0–48.0)
pCO2 arterial: 42.2 mmHg (ref 32.0–48.0)
pCO2 arterial: 43.7 mmHg (ref 32.0–48.0)
pH, Arterial: 7.236 — ABNORMAL LOW (ref 7.350–7.450)
pH, Arterial: 7.25 — ABNORMAL LOW (ref 7.350–7.450)
pH, Arterial: 7.271 — ABNORMAL LOW (ref 7.350–7.450)
pO2, Arterial: 56 mmHg — ABNORMAL LOW (ref 83.0–108.0)
pO2, Arterial: 63 mmHg — ABNORMAL LOW (ref 83.0–108.0)
pO2, Arterial: 69 mmHg — ABNORMAL LOW (ref 83.0–108.0)

## 2020-09-27 LAB — CBC WITH DIFFERENTIAL/PLATELET
Abs Immature Granulocytes: 0.52 10*3/uL — ABNORMAL HIGH (ref 0.00–0.07)
Basophils Absolute: 0.1 10*3/uL (ref 0.0–0.1)
Basophils Relative: 1 %
Eosinophils Absolute: 0.6 10*3/uL — ABNORMAL HIGH (ref 0.0–0.5)
Eosinophils Relative: 4 %
HCT: 46.9 % (ref 39.0–52.0)
Hemoglobin: 15.2 g/dL (ref 13.0–17.0)
Immature Granulocytes: 3 %
Lymphocytes Relative: 9 %
Lymphs Abs: 1.7 10*3/uL (ref 0.7–4.0)
MCH: 31.1 pg (ref 26.0–34.0)
MCHC: 32.4 g/dL (ref 30.0–36.0)
MCV: 96.1 fL (ref 80.0–100.0)
Monocytes Absolute: 0.7 10*3/uL (ref 0.1–1.0)
Monocytes Relative: 4 %
Neutro Abs: 14.5 10*3/uL — ABNORMAL HIGH (ref 1.7–7.7)
Neutrophils Relative %: 79 %
Platelets: 179 10*3/uL (ref 150–400)
RBC: 4.88 MIL/uL (ref 4.22–5.81)
RDW: 15.7 % — ABNORMAL HIGH (ref 11.5–15.5)
WBC: 18 10*3/uL — ABNORMAL HIGH (ref 4.0–10.5)
nRBC: 0.2 % (ref 0.0–0.2)

## 2020-09-27 LAB — GLUCOSE, CAPILLARY
Glucose-Capillary: 178 mg/dL — ABNORMAL HIGH (ref 70–99)
Glucose-Capillary: 213 mg/dL — ABNORMAL HIGH (ref 70–99)
Glucose-Capillary: 111 mg/dL — ABNORMAL HIGH (ref 70–99)
Glucose-Capillary: 223 mg/dL — ABNORMAL HIGH (ref 70–99)
Glucose-Capillary: 206 mg/dL — ABNORMAL HIGH (ref 70–99)
Glucose-Capillary: 382 mg/dL — ABNORMAL HIGH (ref 70–99)

## 2020-09-27 LAB — BILIRUBIN, DIRECT: Bilirubin, Direct: 0.9 mg/dL — ABNORMAL HIGH (ref 0.0–0.2)

## 2020-09-27 LAB — RESP PANEL BY RT-PCR (FLU A&B, COVID) ARPGX2
Influenza A by PCR: NEGATIVE
Influenza B by PCR: NEGATIVE
SARS Coronavirus 2 by RT PCR: NEGATIVE

## 2020-09-27 LAB — APTT: aPTT: 33 seconds (ref 24–36)

## 2020-09-27 LAB — HEMOGLOBIN A1C
Hgb A1c MFr Bld: 5.3 % (ref 4.8–5.6)
Mean Plasma Glucose: 105.41 mg/dL

## 2020-09-27 LAB — CK TOTAL AND CKMB (NOT AT ARMC)
CK, MB: 2.4 ng/mL (ref 0.5–5.0)
Relative Index: INVALID (ref 0.0–2.5)
Total CK: 43 U/L — ABNORMAL LOW (ref 49–397)

## 2020-09-27 LAB — MAGNESIUM: Magnesium: 2.4 mg/dL (ref 1.7–2.4)

## 2020-09-27 LAB — PROTIME-INR
INR: 1.2 (ref 0.8–1.2)
Prothrombin Time: 14.6 seconds (ref 11.4–15.2)

## 2020-09-27 LAB — ECHOCARDIOGRAM COMPLETE
Area-P 1/2: 3.34 cm2
Height: 72.008 in
S' Lateral: 2.9 cm
Weight: 4388.04 oz

## 2020-09-27 LAB — FIBRINOGEN: Fibrinogen: >800 mg/dL — ABNORMAL HIGH (ref 210–475)

## 2020-09-27 LAB — LIPASE, BLOOD: Lipase: 23 U/L (ref 11–51)

## 2020-09-27 LAB — TROPONIN I (HIGH SENSITIVITY): Troponin I (High Sensitivity): 203 ng/L (ref <18)

## 2020-09-27 LAB — PHOSPHORUS: Phosphorus: 5.3 mg/dL — ABNORMAL HIGH (ref 2.5–4.6)

## 2020-09-27 LAB — AMYLASE: Amylase: 25 U/L — ABNORMAL LOW (ref 28–100)

## 2020-09-27 MED ORDER — SODIUM BICARBONATE 8.4 % IV SOLN
INTRAVENOUS | Status: DC
  Filled 2020-09-27 (×4): qty 850

## 2020-09-27 MED ORDER — SODIUM CHLORIDE 0.9 % IV BOLUS
500.0000 mL | Freq: Once | INTRAVENOUS | Status: AC
  Administered 2020-09-27: 500 mL via INTRAVENOUS

## 2020-09-27 MED ORDER — SODIUM CHLORIDE 0.9 % IV SOLN: INTRAVENOUS | Status: DC | PRN

## 2020-09-27 MED ORDER — ALBUMIN HUMAN 5 % IV SOLN
25.0000 g | Freq: Once | INTRAVENOUS | Status: AC
  Administered 2020-09-27: 25 g via INTRAVENOUS
  Filled 2020-09-27: qty 500

## 2020-09-27 MED ORDER — POLYVINYL ALCOHOL 1.4 % OP SOLN
1.0000 [drp] | OPHTHALMIC | Status: DC | PRN
  Filled 2020-09-27: qty 15

## 2020-09-27 MED ORDER — SODIUM CHLORIDE 0.45 % IV SOLN: INTRAVENOUS | Status: DC

## 2020-09-27 NOTE — Progress Notes (Signed)
RT attempted aterieal line times 2 and also another RT attempted arterial line times 2. RN and MD aware. MD will come attempt aline.

## 2020-09-27 NOTE — Progress Notes (Signed)
Echocardiogram 2D Echocardiogram has been performed.  Warren Lacy Milind Raether 09/27/2020, 2:56 PM   Dr. Anne Fu notified of stat echo. CD provided to donor services.

## 2020-09-27 NOTE — Progress Notes (Signed)
PCCM:  Please see notes from overnight. Now on donor services list.  We will be available to assist donor services as needed.  Josephine Igo, DO [facility] 09/27/2020 9:21 AM

## 2020-09-27 NOTE — Progress Notes (Signed)
Pt was transported to CT scan and back without complications.  

## 2020-09-27 NOTE — Procedures (Signed)
Arterial Catheter Insertion Procedure Note  Jonathon Patel  [id number]  [birth year]  Date:09/27/20  Time:3:13 AM    Provider Performing: Duayne Cal    Procedure: Insertion of Arterial Line (84132) with US guidance (44010)   Indication(s) Blood pressure monitoring and/or need for frequent ABGs  Consent Risks of the procedure as well as the alternatives and risks of each were explained to the patient and/or caregiver.  Consent for the procedure was obtained and is signed in the bedside chart  Anesthesia None   Time Out Verified patient identification, verified procedure, site/side was marked, verified correct patient position, special equipment/implants available, medications/allergies/relevant history reviewed, required imaging and test results available.   Sterile Technique Maximal sterile technique including full sterile barrier drape, hand hygiene, sterile gown, sterile gloves, mask, hair covering, sterile ultrasound probe cover (if used).   Procedure Description Area of catheter insertion was cleaned with chlorhexidine and draped in sterile fashion. With real-time ultrasound guidance an arterial catheter was placed into the right femoral artery.  Appropriate arterial tracings confirmed on monitor.     Complications/Tolerance None; patient tolerated the procedure well.   EBL Minimal   Specimen(s) None   Joneen Roach, AGACNP-BC [facility]  See Amion for personal pager PCCM on call pager [phone] until 7pm. Please call Elink 7p-7a. [phone]  09/27/2020 3:13 AM

## 2020-09-27 NOTE — Progress Notes (Signed)
Per Jonathon Patel with [address] Start Sodium Bicarb drip (150 meq in D5) @ 53ml/hr and increase peep on vent to 8.

## 2020-09-28 ENCOUNTER — Encounter (HOSPITAL_COMMUNITY): Admission: EM | Disposition: E | Payer: Self-pay | Source: Home / Self Care | Attending: Internal Medicine

## 2020-09-28 ENCOUNTER — Encounter (HOSPITAL_COMMUNITY): Payer: BC Managed Care – PPO | Admitting: Certified Registered"

## 2020-09-28 ENCOUNTER — Other Ambulatory Visit (HOSPITAL_COMMUNITY): Payer: BC Managed Care – PPO

## 2020-09-28 DIAGNOSIS — I440 Atrioventricular block, first degree: Secondary | ICD-10-CM | POA: Diagnosis not present

## 2020-09-28 DIAGNOSIS — I712 Thoracic aortic aneurysm, without rupture: Secondary | ICD-10-CM | POA: Diagnosis not present

## 2020-09-28 DIAGNOSIS — I469 Cardiac arrest, cause unspecified: Secondary | ICD-10-CM | POA: Diagnosis not present

## 2020-09-28 DIAGNOSIS — N269 Renal sclerosis, unspecified: Secondary | ICD-10-CM | POA: Diagnosis not present

## 2020-09-28 DIAGNOSIS — Z0389 Encounter for observation for other suspected diseases and conditions ruled out: Secondary | ICD-10-CM | POA: Diagnosis not present

## 2020-09-28 DIAGNOSIS — K7581 Nonalcoholic steatohepatitis (NASH): Secondary | ICD-10-CM | POA: Diagnosis not present

## 2020-09-28 DIAGNOSIS — G9382 Brain death: Secondary | ICD-10-CM | POA: Diagnosis not present

## 2020-09-28 HISTORY — PX: ORGAN PROCUREMENT: SHX5270

## 2020-09-28 LAB — GLUCOSE, CAPILLARY
Glucose-Capillary: 150 mg/dL — ABNORMAL HIGH (ref 70–99)
Glucose-Capillary: 179 mg/dL — ABNORMAL HIGH (ref 70–99)
Glucose-Capillary: 198 mg/dL — ABNORMAL HIGH (ref 70–99)
Glucose-Capillary: 203 mg/dL — ABNORMAL HIGH (ref 70–99)
Glucose-Capillary: 235 mg/dL — ABNORMAL HIGH (ref 70–99)
Glucose-Capillary: 292 mg/dL — ABNORMAL HIGH (ref 70–99)
Glucose-Capillary: 295 mg/dL — ABNORMAL HIGH (ref 70–99)
Glucose-Capillary: 333 mg/dL — ABNORMAL HIGH (ref 70–99)
Glucose-Capillary: 342 mg/dL — ABNORMAL HIGH (ref 70–99)

## 2020-09-28 LAB — URINE CULTURE: Culture: NO GROWTH

## 2020-09-28 LAB — POCT I-STAT 7, (LYTES, BLD GAS, ICA,H+H)
Acid-Base Excess: 2 mmol/L (ref 0.0–2.0)
Bicarbonate: 25.6 mmol/L (ref 20.0–28.0)
Calcium, Ion: 1.14 mmol/L — ABNORMAL LOW (ref 1.15–1.40)
HCT: 32 % — ABNORMAL LOW (ref 39.0–52.0)
Hemoglobin: 10.9 g/dL — ABNORMAL LOW (ref 13.0–17.0)
O2 Saturation: 94 %
Patient temperature: 37.1
Potassium: 3.8 mmol/L (ref 3.5–5.1)
Sodium: 147 mmol/L — ABNORMAL HIGH (ref 135–145)
TCO2: 27 mmol/L (ref 22–32)
pCO2 arterial: 37.3 mmHg (ref 32.0–48.0)
pH, Arterial: 7.445 (ref 7.350–7.450)
pO2, Arterial: 69 mmHg — ABNORMAL LOW (ref 83.0–108.0)

## 2020-09-28 LAB — URINALYSIS, ROUTINE W REFLEX MICROSCOPIC
Bacteria, UA: NONE SEEN
Bilirubin Urine: NEGATIVE
Glucose, UA: >=500 mg/dL — AB
Ketones, ur: NEGATIVE mg/dL
Leukocytes,Ua: NEGATIVE
Nitrite: NEGATIVE
Protein, ur: 30 mg/dL — AB
Specific Gravity, Urine: 1.022 (ref 1.005–1.030)
pH: 5 (ref 5.0–8.0)

## 2020-09-28 LAB — BASIC METABOLIC PANEL
Anion gap: 11 (ref 5–15)
BUN: 37 mg/dL — ABNORMAL HIGH (ref 8–23)
CO2: 22 mmol/L (ref 22–32)
Calcium: 8 mg/dL — ABNORMAL LOW (ref 8.9–10.3)
Chloride: 112 mmol/L — ABNORMAL HIGH (ref 98–111)
Creatinine, Ser: 1.52 mg/dL — ABNORMAL HIGH (ref 0.61–1.24)
GFR, Estimated: 51 mL/min — ABNORMAL LOW (ref >60)
Glucose, Bld: 274 mg/dL — ABNORMAL HIGH (ref 70–99)
Potassium: 3.8 mmol/L (ref 3.5–5.1)
Sodium: 145 mmol/L (ref 135–145)

## 2020-09-28 LAB — HEPATIC FUNCTION PANEL
ALT: 30 U/L (ref 0–44)
AST: 18 U/L (ref 15–41)
Albumin: 2.2 g/dL — ABNORMAL LOW (ref 3.5–5.0)
Alkaline Phosphatase: 110 U/L (ref 38–126)
Bilirubin, Direct: 0.4 mg/dL — ABNORMAL HIGH (ref 0.0–0.2)
Indirect Bilirubin: 0.6 mg/dL (ref 0.3–0.9)
Total Bilirubin: 1 mg/dL (ref 0.3–1.2)
Total Protein: 5.8 g/dL — ABNORMAL LOW (ref 6.5–8.1)

## 2020-09-28 LAB — ACID FAST SMEAR (AFB, MYCOBACTERIA): Acid Fast Smear: NEGATIVE

## 2020-09-28 SURGERY — SURGICAL PROCUREMENT, ORGAN
Anesthesia: General

## 2020-09-28 MED ORDER — INSULIN REGULAR(HUMAN) IN NACL 100-0.9 UT/100ML-% IV SOLN
INTRAVENOUS | Status: DC
  Administered 2020-09-28 (×2): 13 [IU]/h via INTRAVENOUS
  Filled 2020-09-28 (×2): qty 100

## 2020-09-28 MED ORDER — HEPARIN SODIUM (PORCINE) 1000 UNIT/ML IJ SOLN
INTRAMUSCULAR | Status: AC
  Filled 2020-09-28: qty 1

## 2020-09-28 MED ORDER — ALBUMIN HUMAN 5 % IV SOLN
12.5000 g | Freq: Once | INTRAVENOUS | Status: AC
  Administered 2020-09-28: 12.5 g via INTRAVENOUS
  Filled 2020-09-28: qty 250

## 2020-09-28 MED ORDER — 0.9 % SODIUM CHLORIDE (POUR BTL) OPTIME
TOPICAL | Status: DC | PRN
  Administered 2020-09-28: 4000 mL

## 2020-09-28 MED ORDER — LACTATED RINGERS IV SOLN: INTRAVENOUS | Status: DC

## 2020-09-28 MED ORDER — ALBUTEROL SULFATE HFA 108 (90 BASE) MCG/ACT IN AERS
INHALATION_SPRAY | RESPIRATORY_TRACT | Status: DC | PRN
  Administered 2020-09-28: 2 via RESPIRATORY_TRACT

## 2020-09-28 MED ORDER — LABETALOL HCL 5 MG/ML IV SOLN
INTRAVENOUS | Status: AC
  Filled 2020-09-28: qty 4

## 2020-09-28 MED ORDER — ROCURONIUM BROMIDE 10 MG/ML (PF) SYRINGE
PREFILLED_SYRINGE | INTRAVENOUS | Status: AC
  Filled 2020-09-28: qty 10

## 2020-09-28 MED ORDER — DEXTROSE 50 % IV SOLN: 0.0000 mL | INTRAVENOUS | Status: DC | PRN

## 2020-09-28 MED ORDER — DEXTROSE IN LACTATED RINGERS 5 % IV SOLN: INTRAVENOUS | Status: DC

## 2020-09-28 MED ORDER — PHENYLEPHRINE HCL-NACL 10-0.9 MG/250ML-% IV SOLN
INTRAVENOUS | Status: DC | PRN
  Administered 2020-09-28: 50 ug/min via INTRAVENOUS

## 2020-09-28 MED ORDER — HEPARIN SODIUM (PORCINE) 1000 UNIT/ML IJ SOLN
INTRAMUSCULAR | Status: DC | PRN
  Administered 2020-09-28: 30000 [IU] via INTRAVENOUS

## 2020-09-28 MED ORDER — DEXTROSE IN LACTATED RINGERS 5 % IV SOLN: Freq: Once | INTRAVENOUS | Status: AC

## 2020-09-28 MED ORDER — ALBUTEROL SULFATE HFA 108 (90 BASE) MCG/ACT IN AERS
INHALATION_SPRAY | RESPIRATORY_TRACT | Status: AC
  Filled 2020-09-28: qty 6.7

## 2020-09-28 MED ORDER — ROCURONIUM BROMIDE 10 MG/ML (PF) SYRINGE
PREFILLED_SYRINGE | INTRAVENOUS | Status: DC | PRN
  Administered 2020-09-28 (×2): 100 mg via INTRAVENOUS

## 2020-09-28 SURGICAL SUPPLY — 56 items
BLADE CLIPPER SURG (BLADE) ×2 IMPLANT
BLADE STERNUM SYSTEM 6 (BLADE) ×2 IMPLANT
CLIP VESOCCLUDE MED 24/CT (CLIP) ×2 IMPLANT
CLIP VESOCCLUDE SM WIDE 24/CT (CLIP) ×2 IMPLANT
CNTNR URN SCR LID CUP LEK RST (MISCELLANEOUS) ×1 IMPLANT
CONT SPEC 4OZ STRL OR WHT (MISCELLANEOUS) ×2
COVER MAYO STAND STRL (DRAPES) ×4 IMPLANT
COVER SURGICAL LIGHT HANDLE (MISCELLANEOUS) ×2 IMPLANT
DRAPE SLUSH MACHINE 52X66 (DRAPES) ×2 IMPLANT
DRSG COVADERM 4X10 (GAUZE/BANDAGES/DRESSINGS) ×4 IMPLANT
DRSG TELFA 3X8 NADH (GAUZE/BANDAGES/DRESSINGS) ×2 IMPLANT
ELECT BLADE 6.5 EXT (BLADE) ×2 IMPLANT
ELECT REM PT RETURN 9FT ADLT (ELECTROSURGICAL) ×2
ELECTRODE REM PT RTRN 9FT ADLT (ELECTROSURGICAL) ×1 IMPLANT
GLOVE SURG POLYISO LF SZ7.5 (GLOVE) ×2 IMPLANT
GLOVE SURG UNDER POLY LF SZ7 (GLOVE) ×2 IMPLANT
GOWN STRL REUS W/ TWL LRG LVL3 (GOWN DISPOSABLE) ×1 IMPLANT
GOWN STRL REUS W/ TWL XL LVL3 (GOWN DISPOSABLE) ×2 IMPLANT
GOWN STRL REUS W/TWL LRG LVL3 (GOWN DISPOSABLE) ×2
GOWN STRL REUS W/TWL XL LVL3 (GOWN DISPOSABLE) ×4
HANDLE SUCTION POOLE (INSTRUMENTS) IMPLANT
KIT POST MORTEM ADULT 36X90 (BAG) ×2 IMPLANT
KIT TURNOVER KIT B (KITS) ×2 IMPLANT
LOOP VESSEL MAXI BLUE (MISCELLANEOUS) IMPLANT
MANIFOLD NEPTUNE II (INSTRUMENTS) ×4 IMPLANT
NEEDLE BIOPSY 14X6 SOFT TISS (NEEDLE) ×2 IMPLANT
NS IRRIG 1000ML POUR BTL (IV SOLUTION) ×8 IMPLANT
PACK AORTA (CUSTOM PROCEDURE TRAY) ×2 IMPLANT
PAD ARMBOARD 7.5X6 YLW CONV (MISCELLANEOUS) ×4 IMPLANT
PENCIL BUTTON HOLSTER BLD 10FT (ELECTRODE) ×2 IMPLANT
SPONGE LAP 18X18 RF (DISPOSABLE) ×8 IMPLANT
STAPLER VISISTAT 35W (STAPLE) ×2 IMPLANT
SUCTION POOLE HANDLE (INSTRUMENTS)
SUT CHROMIC 3 0 SH 27 (SUTURE) ×2 IMPLANT
SUT ETHILON 1 LR 30 (SUTURE) ×4 IMPLANT
SUT ETHILON 2 LR (SUTURE) IMPLANT
SUT PROLENE 3 0 RB 1 (SUTURE) IMPLANT
SUT PROLENE 3 0 SH 1 (SUTURE) IMPLANT
SUT PROLENE 4 0 RB 1 (SUTURE) ×4
SUT PROLENE 4-0 RB1 .5 CRCL 36 (SUTURE) ×2 IMPLANT
SUT PROLENE 5 0 C 1 24 (SUTURE) IMPLANT
SUT PROLENE 6 0 BV (SUTURE) IMPLANT
SUT SILK 0 TIES 10X30 (SUTURE) IMPLANT
SUT SILK 1 SH (SUTURE) IMPLANT
SUT SILK 1 TIES 10X30 (SUTURE) IMPLANT
SUT SILK 2 0 (SUTURE)
SUT SILK 2 0 SH (SUTURE) IMPLANT
SUT SILK 2 0 SH CR/8 (SUTURE) ×2 IMPLANT
SUT SILK 2 0 TIES 10X30 (SUTURE) IMPLANT
SUT SILK 2-0 18XBRD TIE 12 (SUTURE) IMPLANT
SUT SILK 3 0 SH CR/8 (SUTURE) IMPLANT
SUT SILK 3 0 TIES 10X30 (SUTURE) IMPLANT
SYR 50ML LL SCALE MARK (SYRINGE) IMPLANT
SYRINGE TOOMEY DISP (SYRINGE) IMPLANT
TUBE CONNECTING 12X1/4 (SUCTIONS) ×2 IMPLANT
YANKAUER SUCT BULB TIP NO VENT (SUCTIONS) ×2 IMPLANT

## 2020-09-28 NOTE — Anesthesia Procedure Notes (Addendum)
Procedure Name: Intubation Performed by: Ponciano Ort, CRNA Pre-anesthesia Checklist: Patient identified, Emergency Drugs available, Suction available and Patient being monitored Patient Re-evaluated:Patient Re-evaluated prior to induction Oxygen Delivery Method: Circle system utilized Preoxygenation: Pre-oxygenation with 100% oxygen Induction Type: IV induction Ventilation: Mask ventilation without difficulty Laryngoscope Size: Glidescope and 4 Grade View: Grade I Tube type: Oral Number of attempts: 1 Airway Equipment and Method: Bougie stylet (tube exchanger used. ) Placement Confirmation: ETT inserted through vocal cords under direct vision,  positive ETCO2 and breath sounds checked- equal and bilateral Tube secured with: Tape Dental Injury: Teeth and Oropharynx as per pre-operative assessment

## 2020-09-28 NOTE — OR Nursing (Signed)
Spoke with Dr. Tessie Fass in radiology department who stated there are no retained objects seen on post operative x-ray.

## 2020-09-28 NOTE — Progress Notes (Signed)
Patient transported to OR and placed on CRNA vent.

## 2020-09-28 NOTE — Transfer of Care (Signed)
Immediate Anesthesia Transfer of Care Note  Patient: Jonathon Patel  Procedure(s) Performed: ORGAN PROCUREMENT (N/A )  Patient Location: OR  Anesthesia Type:General  Level of Consciousness: Brain dead organ donor  Airway & Oxygen Therapy: Endotracheal tube remains in place  Post-op Assessment: Report given to RN  Post vital signs: Reviewed  Last Vitals:  Vitals Value Taken Time  BP    Temp    Pulse    Resp    SpO2      Complications: No complications documented.

## 2020-09-28 NOTE — Anesthesia Preprocedure Evaluation (Addendum)
Anesthesia Evaluation  Patient identified by MRN, date of birth, ID band Patient unresponsive    Reviewed: Allergy & Precautions, NPO status , Patient's Chart, lab work & pertinent test results  Airway Mallampati: Intubated       Dental   Pulmonary  Intubated with cuff leak      + intubated    Cardiovascular hypertension, Pt. on medications Normal cardiovascular exam  ECG: NSR, rate 81  ECHO: 1. Left ventricular ejection fraction, by estimation, is 70 to 75%. The left ventricle has hyperdynamic function. The left ventricle has no regional wall motion abnormalities. There is mild left ventricular hypertrophy. Left ventricular diastolic parameters are consistent with Grade I diastolic dysfunction (impaired relaxation). 2. Right ventricular systolic function is normal. The right ventricular size is normal. 3. The mitral valve is normal in structure. No evidence of mitral valve regurgitation. No evidence of mitral stenosis. 4. The aortic valve was not well visualized. Aortic valve regurgitation is not visualized. Noaortic stenosis is present. 5. The inferior vena cava is normal in size with greater    Neuro/Psych Seizures -,  Brain dead    GI/Hepatic negative GI ROS, (+)     substance abuse  ,   Endo/Other  negative endocrine ROS  Renal/GU Renal disease     Musculoskeletal  (+) Arthritis ,   Abdominal (+) + obese,   Peds  Hematology  (+) anemia ,   Anesthesia Other Findings Brain dead organ donor  Reproductive/Obstetrics                            Anesthesia Physical Anesthesia Plan  ASA: VI  Anesthesia Plan: General   Post-op Pain Management:    Induction: Intravenous  PONV Risk Score and Plan: 2 and Treatment may vary due to age or medical condition  Airway Management Planned: Oral ETT  Additional Equipment:   Intra-op Plan:   Post-operative Plan:   Informed Consent:    Plan Discussed with: CRNA  Anesthesia Plan Comments: (Arterial line in place Central line in place)       Anesthesia Quick Evaluation

## 2020-09-28 NOTE — Progress Notes (Signed)
PCCM:  Available for any donor services needs.   Josephine Igo, DO [facility] 09/04/2020 10:57 AM

## 2020-09-28 NOTE — Anesthesia Postprocedure Evaluation (Signed)
Anesthesia Post Note  Patient: Jonathon Patel  Procedure(s) Performed: ORGAN PROCUREMENT (N/A )     Patient location during evaluation: Other (OR) Anesthesia Type: General Anesthetic complications: no Comments: Brain dead organ donor   No complications documented.  Last Vitals:  Vitals:   09/27/2020 1200 09/17/2020 1259  BP: (!) 150/101   Pulse: 91   Resp: (!) 30   Temp: 37.1 C   SpO2: 97% 100%    Last Pain:  Vitals:   09/11/2020 1200  TempSrc: Bladder  PainSc:                  Catheryn Bacon Ellender

## 2020-09-29 LAB — TYPE AND SCREEN
ABO/RH(D): O POS
Antibody Screen: NEGATIVE
Unit division: 0
Unit division: 0
Unit division: 0
Unit division: 0

## 2020-09-29 LAB — BPAM RBC
Blood Product Expiration Date: 202203252359
Blood Product Expiration Date: 202203252359
Blood Product Expiration Date: 202203272359
Blood Product Expiration Date: 202203272359
Unit Type and Rh: 5100
Unit Type and Rh: 5100
Unit Type and Rh: 5100
Unit Type and Rh: 5100

## 2020-09-30 ENCOUNTER — Encounter (HOSPITAL_COMMUNITY): Payer: Self-pay

## 2020-09-30 LAB — CULTURE, RESPIRATORY W GRAM STAIN

## 2020-10-01 DIAGNOSIS — 419620001 Death: Secondary | SNOMED CT | POA: Diagnosis not present

## 2020-10-01 LAB — SURGICAL PATHOLOGY
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
## 2020-10-01 NOTE — Procedures (Signed)
Bronchoscopy Procedure Note  Jonathon Patel  [id number]  06-23-1956  Date:10/15/2020  Time:10:46 PM   Provider Performing:Journee Kohen B Cephus Tupy   Procedure(s):  Flexible bronchoscopy with bronchial alveolar lavage (62130)  Indication(s) Organ Donor Requirements  Consent Unable to obtain consent due to emergent nature of procedure.  Anesthesia None   Time Out Verified patient identification, verified procedure, site/side was marked, verified correct patient position, special equipment/implants available, medications/allergies/relevant history reviewed, required imaging and test results available.   Sterile Technique Usual hand hygiene, masks, gowns, and gloves were used   Procedure Description Bronchoscope advanced through endotracheal tube and into airway.  Airways were examined down to subsegmental level with findings noted below.   Following diagnostic evaluation, BAL(s) performed in LUL and RUL with normal saline and return of 57m and 344mfluid respectively  Findings:  Normal appearing airways with copious amounts of purulent secretions throughout both bronchial trees.  No excessive dynamic airway compression noted.   Complications/Tolerance None; patient tolerated the procedure well. Chest X-ray is not needed post procedure.   EBL Minimal   Specimen(s) LUL BAL sample sent to [facility] RUL BAL sample sent to [facility] with Organ Donor Representative

## 2020-10-01 NOTE — Progress Notes (Signed)
Apnea performed per MD order for . 8L O2 tubing placed in ETT for 8 min. Patient tolerating well, vital signs stable throughout, sats maintained 95 or greater during test. No spontaneous respirations noted. Test ended at 955. Will obtain ABG

## 2020-10-01 NOTE — Progress Notes (Signed)
11-19-2118- P't family left at this time.  Maria with [employer] had several discussions with pt's Wife, daughter and son. Signed [facility] paperwork is on the chart. Patients family has chosen to move forward with organ donation.   Pt's family chose to leave at this time, and not be present for brain death testing.  November 18, 2145- RT Alecia Lemming, and Dr. Francine Graven at bedside for brain death testing.  2205/11/18- Dr. Francine Graven pronounce patient Brain death. Notified family. (See chart for brain death checklist.)  [facility] assumed care of patient, per family wishes. See orders.

## 2020-10-01 NOTE — Progress Notes (Signed)
PCCM Update:  Brain death is present based on Adult Brain Death Determination Check List.  Apnea test performed with a baseline pCO2 of 50mmHg which increased to pCO2 of 32mmHg.   Family updated over the phone.   Time of death pronounced at 10:07pm.  Freda Jackson, MD [facility] Office: [phone]   See Amion for Pager Details   Adult Brain Death Determination  Time of Examination: 09/27/20 6:58 AM  A. No Evidence of /Cause of Reversible CNS Depression  1. Core temperature must be greater >36 degrees. Last temp: Temp: 98.4 F (36.9 C) (Note: If unable to achieve normothermia after 12 hours of temperature management, may consider proceeding with Brain Death Evaluation.):    yes  2. Evidence of severe metabolic perturbations that could potentate CNS depression. Consider glucose, Na, creatinine, PaCO2, SaO2.:    Absent  3. Evidence of drugs, by history or measurement, that could potentiate central nervous system depression: narcotics, ethanol, benzodiazepines, barbiturates, neuromuscular blockade.:     Absent  B. Absence of Cortical Function  1. GCS = 3:    yes  C. Absence of Brain Stem Reflexes and Responses  1. Pupils light-fixed    yes  2. Corneal reflexes:    Absent  3. Response to upper and lower airway stimulation, such as pharyngeal and endotracheal suctioning.:    Absent  4. Ocular response to head turning (eye movement).    Absent  D. Absence of Spontaneous Respirations  (Apnea test performed per Brain Death Policy. If not met due to hemodynamic/ventilatory instability, then perform EEG, TCD, or cerebral blow flow studies.)  1.   Spontaneous Respirations   Absent  2.   PaCO2 at start of apnea test:  30 mmHg   3.   PaCO2 at end of apnea test:  80 mmHg  4.   CO2 rise of 20 or greater from baseline:   yes  E. Document Confirmatory Test Utilized: (Optional) Nuclear cerebral flow, cerebral angiography (CT/MR angio), transcranial Doppler  ultrasound, EEG, SSEP (record results).  1. Test results (if available):  N/a  Patient pronounced dead by neurological criteria at 10:07pm PM on 09/27/20.  Freddi Starr, MD 09/27/2020 6:58 AM

## 2020-10-01 DEATH — deceased

## 2020-10-02 LAB — CULTURE, BLOOD (ROUTINE X 2)
Culture: NO GROWTH
Culture: NO GROWTH
Special Requests: ADEQUATE
Special Requests: ADEQUATE

## 2020-10-21 ENCOUNTER — Ambulatory Visit: Payer: BC Managed Care – PPO | Admitting: Cardiovascular Disease

## 2020-11-01 NOTE — Death Summary Note (Signed)
DEATH SUMMARY   Patient Details  Name: Jonathon Patel MRN: [id number] DOB: [birth year]  Admission/Discharge Information   Admit Date:  10-05-20  Date of Death: Date of Death: 10/10/2020  Time of Death: Time of Death: 12-17-05  Length of Stay: 7  Referring Physician: Shelva Majestic, MD   Reason(s) for Hospitalization  Cardiac arrest  Diagnoses  Preliminary cause of death:  Secondary Diagnoses (including complications and co-morbidities):  Principal Problem:   Cardiac arrest [facility]) Active Problems:   COVID   Brief Hospital Course (including significant findings, care, treatment, and services provided and events leading to death)  65 year old man with hx of traumatic seizures, HTN, COVID infection a few weeks ago presenting after OOH cardiac arrest.  Given 7 rounds of epi with ROSC finally achieved by ER arrival.  Brooke Dare airway exchanged for ETT in ER.  GCS3 after ROSC.  PCCM asked to admit.  CTA C/A/P/Head pending.  Per wife and son, patient was in usually state of health, recently returned after trip to [place] where he was under quarantine after a COVID infection.  He was reportedly negative [place].  Today he had vague abdominal discomfort prior to passing out in front of family.  Underwent targeted temperature management.  Found to have large GIB shortly after arrival to ICU which may have explained arrest (?exagerrated vagal response).  Did not wake up after targeted temperature management with MRI confirming severe anoxic brain injury.  He subsequently became drain dead as donor discussions with family were being had.   Pertinent Labs and Studies  Significant Diagnostic Studies DG Chest 1 View  Result Date: 09/27/2020 CLINICAL DATA:  Encounter for organ donation EXAM: CHEST  1 VIEW COMPARISON:  09/24/2020 FINDINGS: Endotracheal tube tip projects at the thoracic inlet and is about 9.5 cm superior to carina. Esophageal tube tip below the diaphragm. Left IJ  central venous catheter tip over the upper SVC. Interim development of bibasilar airspace disease. Diffuse hazy right lung opacity potentially due to layering effusion. Borderline cardiomegaly. No pneumothorax. IMPRESSION: 1. Endotracheal tube tip projects at the thoracic inlet and is about 9.5 cm superior to carina. 2. Interim development of bibasilar airspace disease/consolidation. Probable layering right effusion resulting in diffuse hazy opacity in the right thorax. Electronically Signed   By: Jasmine Pang M.D.   On: 09/27/2020 16:58   DG Abdomen 1 View  Result Date: 10-05-2020 CLINICAL DATA:  65 year old male status post intubation EXAM: ABDOMEN - 1 VIEW COMPARISON:  None. FINDINGS: Limited plain film of the abdomen. Gastric tube terminates in the left upper quadrant within the stomach. Defibrillator pads overlying the lower chest/upper abdomen on the left. Gaseous distention of the stomach. Gas within colon.  Relative paucity of small bowel gas. IMPRESSION: Gastric tube terminates within the stomach with gaseous distension. Electronically Signed   By: Gilmer Mor D.O.   On: 10/05/2020 10:27   CT Head Wo Contrast  Result Date: 10/05/20 CLINICAL DATA:  Onset altered mental status today. History of traumatic seizures. EXAM: CT HEAD WITHOUT CONTRAST CT CERVICAL SPINE WITHOUT CONTRAST TECHNIQUE: Multidetector CT imaging of the head and cervical spine was performed following the standard protocol without intravenous contrast. Multiplanar CT image reconstructions of the cervical spine were also generated. COMPARISON:  None. FINDINGS: CT HEAD FINDINGS Brain: No evidence of acute infarction, hemorrhage, hydrocephalus, extra-axial collection or mass lesion/mass effect. Small, remote right frontal infarct noted. Vascular: No hyperdense vessel or unexpected calcification. Skull: Intact.  No focal  lesion. Sinuses/Orbits: There is scattered ethmoid air cell disease. Very short air-fluid levels are seen in the  maxillary and sphenoid sinuses. Other: ET tube and OG tube noted. CT CERVICAL SPINE FINDINGS Alignment: Normal. Skull base and vertebrae: No acute fracture. No primary bone lesion or focal pathologic process. Soft tissues and spinal canal: No prevertebral fluid or swelling. No visible canal hematoma. Disc levels: Loss of disc space height and endplate sclerosis are seen at C4-5 and C5-6. Upper chest: See report of dedicated chest CT this same day. Other: None. IMPRESSION: No acute intracranial abnormality. No acute abnormality cervical spine. Small, remote right frontal infarct. C4-5 and C5-6 degenerative disc disease. Mild sinus disease. Electronically Signed   By: Drusilla Kanner M.D.   On: 09/17/2020 13:52   CT Angio Chest PE W and/or Wo Contrast  Result Date: 09/23/2020 CLINICAL DATA:  Vague abdominal discomfort and syncope. Clinical concern for pulmonary embolus. EXAM: CT ANGIOGRAPHY CHEST CT ABDOMEN AND PELVIS WITH CONTRAST TECHNIQUE: Multidetector CT imaging of the chest was performed using the standard protocol during bolus administration of intravenous contrast. Multiplanar CT image reconstructions and MIPs were obtained to evaluate the vascular anatomy. Multidetector CT imaging of the abdomen and pelvis was performed using the standard protocol during bolus administration of intravenous contrast. CONTRAST:  60mL OMNIPAQUE IOHEXOL 350 MG/ML SOLN COMPARISON:  CTA chest 08/30/2020 FINDINGS: CTA CHEST FINDINGS Cardiovascular: The heart size is normal. No substantial pericardial effusion. Ascending thoracic aorta measures 4.6 cm in diameter today, not substantially changed from 4.5 cm when remeasured in a similar fashion at a similar location on the prior study. No thoracic aortic dissection. There is no filling defect within the opacified pulmonary arteries to suggest the presence of an acute pulmonary embolus. Mediastinum/Nodes: No mediastinal lymphadenopathy. Endotracheal tube tip is positioned in the  mid to distal trachea. NG tube visualized in the esophagus. There is no hilar lymphadenopathy. There is no axillary lymphadenopathy. Lungs/Pleura: Bilateral lower lobe consolidative opacity noted, right greater than left with right suprahilar ground-glass nodules measuring up to 10 mm, new since prior. Irregular parahilar nodular opacity identified in the right middle and lower lobes. No pleural effusion. Musculoskeletal: No worrisome lytic or sclerotic osseous abnormality. Intra-articular loose body noted left shoulder. Review of the MIP images confirms the above findings. CT ABDOMEN and PELVIS FINDINGS Hepatobiliary: No suspicious focal abnormality within the liver parenchyma. There is no evidence for gallstones, gallbladder wall thickening, or pericholecystic fluid. No intrahepatic or extrahepatic biliary dilation. Pancreas: No focal mass lesion. No dilatation of the main duct. No intraparenchymal cyst. No peripancreatic edema. Spleen: No splenomegaly. No focal mass lesion. Adrenals/Urinary Tract: No adrenal nodule or mass. Cortical scarring noted in both kidneys. No hydronephrosis or suspicious renal mass. No evidence for hydroureter. Bladder is decompressed by Foley catheter and gas in the bladder lumen is compatible with the instrumentation. Stomach/Bowel: NG tube tip tents the anterior wall of the mid stomach. Duodenum is normally positioned as is the ligament of Treitz. No small bowel wall thickening. No small bowel dilatation. The terminal ileum is normal. The appendix is not well visualized, but there is no edema or inflammation in the region of the cecum. Colon is diffusely distended in largely fluid-filled to the level of the mid sigmoid segment with formed stool in the distal rect sigmoid colon and rectum. Um Vascular/Lymphatic: No abdominal aortic aneurysm. No dissection flap in the abdominal aorta. There is some minimal atherosclerotic calcification in the wall of the abdominal aorta. There is no  gastrohepatic or hepatoduodenal ligament lymphadenopathy. No retroperitoneal or mesenteric lymphadenopathy. No pelvic sidewall lymphadenopathy. Reproductive: The prostate gland and seminal vesicles are unremarkable. Other: No intraperitoneal free fluid. Musculoskeletal: No worrisome lytic or sclerotic osseous abnormality. Review of the MIP images confirms the above findings. IMPRESSION: 1. No CT evidence for acute pulmonary embolus. 2. Stable ascending thoracic aortic diameter at 4.6 cm without evidence for dissection. Ascending thoracic aortic aneurysm. Recommend semi-annual imaging followup by CTA or MRA and referral to cardiothoracic surgery if not already obtained. This recommendation follows 2010 ACCF/AHA/AATS/ACR/ASA/SCA/SCAI/SIR/STS/SVM Guidelines for the Diagnosis and Management of Patients With Thoracic Aortic Disease. Circulation. 2010; 121: Z610-R604: E266-e369. Aortic aneurysm NOS (ICD10-I71.9) 3. No abdominal aortic aneurysm. 4. Bilateral lower lobe consolidative opacity with right suprahilar ground-glass nodules and irregular parahilar nodular opacity in the right middle and lower lobes. Imaging features are most suggestive of multifocal pneumonia. Aspiration not excluded. 5. Diffusely distended colon with largely fluid-filled colon. No associated colonic wall thickening. Diarrheal illness a consideration. 6. Aortic Atherosclerosis (ICD10-I70.0). Electronically Signed   By: Kennith CenterEric  Mansell M.D.   On: 09-Sep-2020 14:31   CT Cervical Spine Wo Contrast  Result Date: 07/12/21 CLINICAL DATA:  Onset altered mental status today. History of traumatic seizures. EXAM: CT HEAD WITHOUT CONTRAST CT CERVICAL SPINE WITHOUT CONTRAST TECHNIQUE: Multidetector CT imaging of the head and cervical spine was performed following the standard protocol without intravenous contrast. Multiplanar CT image reconstructions of the cervical spine were also generated. COMPARISON:  None. FINDINGS: CT HEAD FINDINGS Brain: No evidence of acute  infarction, hemorrhage, hydrocephalus, extra-axial collection or mass lesion/mass effect. Small, remote right frontal infarct noted. Vascular: No hyperdense vessel or unexpected calcification. Skull: Intact.  No focal lesion. Sinuses/Orbits: There is scattered ethmoid air cell disease. Very short air-fluid levels are seen in the maxillary and sphenoid sinuses. Other: ET tube and OG tube noted. CT CERVICAL SPINE FINDINGS Alignment: Normal. Skull base and vertebrae: No acute fracture. No primary bone lesion or focal pathologic process. Soft tissues and spinal canal: No prevertebral fluid or swelling. No visible canal hematoma. Disc levels: Loss of disc space height and endplate sclerosis are seen at C4-5 and C5-6. Upper chest: See report of dedicated chest CT this same day. Other: None. IMPRESSION: No acute intracranial abnormality. No acute abnormality cervical spine. Small, remote right frontal infarct. C4-5 and C5-6 degenerative disc disease. Mild sinus disease. Electronically Signed   By: Drusilla Kannerhomas  Dalessio M.D.   On: 09-Sep-2020 13:52   MR BRAIN WO CONTRAST  Result Date: 09/24/2020 CLINICAL DATA:  [place] a brain damage. EXAM: MRI HEAD WITHOUT CONTRAST TECHNIQUE: Multiplanar, multiecho pulse sequences of the brain and surrounding structures were obtained without intravenous contrast. COMPARISON:  None. FINDINGS: Brain: There is extensive abnormal restricted diffusion and edema involving the infratentorial and supratentorial cortex as well as bilateral basal ganglia, thalami, and hippocampi/amygdala. Resulting diffuse sulcal effacement. There is also evidence of downward herniation with effacement of the suprasellar cistern, basal cisterns and down words tonsillar herniation with crowding of the posterior fossa. No hydrocephalus. No midline shift. No acute hemorrhage. Focal encephalomalacia in the right anterior frontal lobe with associated susceptibility artifact, possibly related to prior hemorrhage. Vascular:  Major arterial flow voids are maintained at the skull base. Skull and upper cervical spine: Normal marrow signal. Sinuses/Orbits: Paranasal sinus mucosal thickening. Unremarkable orbits. Other: Trace bilateral effusions. IMPRESSION: 1. Findings consistent with severe global hypoxic/ischemic injury, as detailed above. Resulting diffuse edema with sulcal effacement and downward herniation with effacement of the suprasellar cistern, basal  cisterns and downward tonsillar herniation with posterior fossa crowding. 2. 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 These results will be called to the ordering clinician or representative by the Radiologist Assistant, and communication documented in the PACS or [employer]. Electronically Signed   By: Feliberto Harts MD   On: 09/24/2020 16:21   CT ABDOMEN PELVIS W CONTRAST  Result Date: 2020-09-23 CLINICAL DATA:  Vague abdominal discomfort and syncope. Clinical concern for pulmonary embolus. EXAM: CT ANGIOGRAPHY CHEST CT ABDOMEN AND PELVIS WITH CONTRAST TECHNIQUE: Multidetector CT imaging of the chest was performed using the standard protocol during bolus administration of intravenous contrast. Multiplanar CT image reconstructions and MIPs were obtained to evaluate the vascular anatomy. Multidetector CT imaging of the abdomen and pelvis was performed using the standard protocol during bolus administration of intravenous contrast. CONTRAST:  64mL OMNIPAQUE IOHEXOL 350 MG/ML SOLN COMPARISON:  CTA chest 08/30/2020 FINDINGS: CTA CHEST FINDINGS Cardiovascular: The heart size is normal. No substantial pericardial effusion. Ascending thoracic aorta measures 4.6 cm in diameter today, not substantially changed from 4.5 cm when remeasured in a similar fashion at a similar location on the prior study. No thoracic aortic dissection. There is no filling defect within the opacified pulmonary arteries to suggest the presence of an acute pulmonary  embolus. Mediastinum/Nodes: No mediastinal lymphadenopathy. Endotracheal tube tip is positioned in the mid to distal trachea. NG tube visualized in the esophagus. There is no hilar lymphadenopathy. There is no axillary lymphadenopathy. Lungs/Pleura: Bilateral lower lobe consolidative opacity noted, right greater than left with right suprahilar ground-glass nodules measuring up to 10 mm, new since prior. Irregular parahilar nodular opacity identified in the right middle and lower lobes. No pleural effusion. Musculoskeletal: No worrisome lytic or sclerotic osseous abnormality. Intra-articular loose body noted left shoulder. Review of the MIP images confirms the above findings. CT ABDOMEN and PELVIS FINDINGS Hepatobiliary: No suspicious focal abnormality within the liver parenchyma. There is no evidence for gallstones, gallbladder wall thickening, or pericholecystic fluid. No intrahepatic or extrahepatic biliary dilation. Pancreas: No focal mass lesion. No dilatation of the main duct. No intraparenchymal cyst. No peripancreatic edema. Spleen: No splenomegaly. No focal mass lesion. Adrenals/Urinary Tract: No adrenal nodule or mass. Cortical scarring noted in both kidneys. No hydronephrosis or suspicious renal mass. No evidence for hydroureter. Bladder is decompressed by Foley catheter and gas in the bladder lumen is compatible with the instrumentation. Stomach/Bowel: NG tube tip tents the anterior wall of the mid stomach. Duodenum is normally positioned as is the ligament of Treitz. No small bowel wall thickening. No small bowel dilatation. The terminal ileum is normal. The appendix is not well visualized, but there is no edema or inflammation in the region of the cecum. Colon is diffusely distended in largely fluid-filled to the level of the mid sigmoid segment with formed stool in the distal rect sigmoid colon and rectum. Um Vascular/Lymphatic: No abdominal aortic aneurysm. No dissection flap in the abdominal aorta.  There is some minimal atherosclerotic calcification in the wall of the abdominal aorta. There is no gastrohepatic or hepatoduodenal ligament lymphadenopathy. No retroperitoneal or mesenteric lymphadenopathy. No pelvic sidewall lymphadenopathy. Reproductive: The prostate gland and seminal vesicles are unremarkable. Other: No intraperitoneal free fluid. Musculoskeletal: No worrisome lytic or sclerotic osseous abnormality. Review of the MIP images confirms the above findings. IMPRESSION: 1. No CT evidence for acute pulmonary embolus. 2. Stable ascending thoracic aortic diameter at 4.6 cm without evidence for dissection. Ascending thoracic aortic aneurysm. Recommend semi-annual imaging followup by CTA  or MRA and referral to cardiothoracic surgery if not already obtained. This recommendation follows 2010 ACCF/AHA/AATS/ACR/ASA/SCA/SCAI/SIR/STS/SVM Guidelines for the Diagnosis and Management of Patients With Thoracic Aortic Disease. Circulation. 2010; 121: S568-L275. Aortic aneurysm NOS (ICD10-I71.9) 3. No abdominal aortic aneurysm. 4. Bilateral lower lobe consolidative opacity with right suprahilar ground-glass nodules and irregular parahilar nodular opacity in the right middle and lower lobes. Imaging features are most suggestive of multifocal pneumonia. Aspiration not excluded. 5. Diffusely distended colon with largely fluid-filled colon. No associated colonic wall thickening. Diarrheal illness a consideration. 6. Aortic Atherosclerosis (ICD10-I70.0). Electronically Signed   By: Kennith Center M.D.   On: 09/24/2020 14:31   DG CHEST PORT 1 VIEW  Result Date: 09/24/2020 CLINICAL DATA:  Intubation.  COVID positive EXAM: PORTABLE CHEST 1 VIEW COMPARISON:  09/27/2020. FINDINGS: Endotracheal tube and left IJ line stable position. NG tube side hole noted gastroesophageal junction, advancement of approximately 10 cm suggested. Heart size stable. Low lung volumes. Right mid lung infiltrate again noted. Interim improvement  prior exam. No pleural effusion or pneumothorax. IMPRESSION: 1. NG tube side hole noted at gastroesophageal junction, advancement approximately 10 cm suggested. Endotracheal tube and left IJ line stable position. 2. Low lung volumes. Right mid lung infiltrate again noted. Interim improvement from prior exam. Electronically Signed   By: Maisie Fus  Register   On: 09/24/2020 06:57   DG Chest Portable 1 View  Result Date: 09/15/2020 CLINICAL DATA:  Central line placement EXAM: PORTABLE CHEST 1 VIEW COMPARISON:  September 21, 2020 FINDINGS: Right-sided infiltrate is stable. The ETT is in good position. A left-sided central line is noted. No pneumothorax. No other changes. The NG tube terminates below today's film. IMPRESSION: 1. A new left central line is been placed terminating in the central SVC without pneumothorax. 2. Stable right pulmonary infiltrate. Electronically Signed   By: Gerome Sam III M.D   On: 09/14/2020 14:53   DG Chest Portable 1 View  Result Date: 09/13/2020 CLINICAL DATA:  64 year old male status post intubation EXAM: PORTABLE CHEST 1 VIEW COMPARISON:  CT chest 08/30/2020 FINDINGS: Cardiomediastinal silhouette accentuated with the low lung volumes. Endotracheal tube terminates 3.6 cm above the carina. Patchy airspace disease in the right suprahilar region and right infrahilar region. No pneumothorax or pleural effusion. Defibrillator pads on the left chest. IMPRESSION: Endotracheal tube terminates suitably above the carina. Low lung volumes, with patchy opacities in the right lung potentially atelectasis and/or consolidation. Electronically Signed   By: Gilmer Mor D.O.   On: 09/11/2020 10:26   DG Abd Portable 1V  Result Date: 09/15/2020 CLINICAL DATA:  Organ procurement, clearing for foreign body. EXAM: PORTABLE ABDOMEN - 1 VIEW.  Supine. COMPARISON:  CT chest, abdomen, pelvis 09/27/2020 FINDINGS: Bilateral flanks are collimated off view. The bowel gas pattern is normal. No radio-opaque  calculi or other significant radiographic abnormality are seen. Temperature probe Foley is noted overlying the pelvis. No retained radiopaque foreign body. IMPRESSION: No retained radiopaque foreign body on this x-ray abdomen and pelvis on which bilateral flanks are collimated off view. These results were called by telephone at the time of interpretation on 09/13/2020 at 7:11 pm to OR provider at [phone], who verbally acknowledged these results. Electronically Signed   By: Tish Frederickson M.D.   On: 09/27/2020 19:13   EEG adult  Result Date: 09/09/2020 Charlsie Quest, MD     09/22/2020  9:38 AM Patient Name: Jonathon Patel MRN: [id number] Epilepsy Attending: Charlsie Quest Referring Physician/Provider: Dr Levon Hedger Date: 09/17/2020 Duration:  22.26 mins Patient history: 65 year old man with hx of traumatic seizures, HTN, COVID infection a few weeks ago presenting with OOH PEA arrest. EEG to evaluate for seizure Level of alertness: comatose AEDs during EEG study: Propofol Technical aspects: This EEG study was done with scalp electrodes positioned according to the 10-20 International system of electrode placement. Electrical activity was acquired at a sampling rate of 500Hz  and reviewed with a high frequency filter of 70Hz  and a low frequency filter of 1Hz . EEG data were recorded continuously and digitally stored. Description: EEG showed continuous  generalized background suppression.   Hyperventilation and photic stimulation were not performed.   ABNORMALITY - Background suppression., generalized IMPRESSION: This study is suggestive of profound diffuse encephalopathy, nonspecific etiology. No seizures or epileptiform discharges were seen throughout the recording. Priyanka Annabelle Harman   Overnight EEG with video  Result Date: 09/22/2020 Charlsie Quest, MD     09/23/2020  8:49 AM Patient Name: Jonathon Patel MRN: [id number] Epilepsy Attending: Charlsie Quest Referring Physician/Provider: Dr Levon Hedger  Duration: October 02, 2020 1813 to 09/22/2020 1813  Patient history: 65 year old man with hx of traumatic seizures, HTN, COVID infection a few weeks ago presenting with OOH PEA arrest. EEG to evaluate for seizure  Level of alertness: comatose  AEDs during EEG study: Propofol  Technical aspects: This EEG study was done with scalp electrodes positioned according to the 10-20 International system of electrode placement. Electrical activity was acquired at a sampling rate of 500Hz  and reviewed with a high frequency filter of 70Hz  and a low frequency filter of 1Hz . EEG data were recorded continuously and digitally stored. Description: EEG showed continuous  generalized background suppression. EEG was not reactive to tactile stimulation.   ABNORMALITY - Background suppression., generalized  IMPRESSION: This study is suggestive of profound diffuse encephalopathy, nonspecific etiology. No seizures or epileptiform discharges were seen throughout the recording.  Charlsie Quest   ECHOCARDIOGRAM COMPLETE  Result Date: 09/27/2020    ECHOCARDIOGRAM REPORT   Patient Name:   Jonathon Patel Date of Exam: 09/27/2020 Medical Rec #:  [id number]    Height:       72.0 in Accession #:    [id number]   Weight:       274.3 lb Date of Birth:  16-Nov-1955    BSA:          2.437 m Patient Age:    64 years     BP:           155/83 mmHg Patient Gender: M            HR:           114 bpm. Exam Location:  Inpatient Procedure: 2D Echo, Color Doppler and Cardiac Doppler STAT ECHO Indications:    Organ Donation  History:        Patient has prior history of Echocardiogram examinations, most                 recent 02-Oct-2020. Risk Factors:Hypertension, Dyslipidemia and                 COVID+ 08/2020.  Sonographer:    Irving Burton Senior RDCS Referring Phys: [id number] Josephine Igo  Sonographer Comments: Technically difficult due to patient body habitus. Exam performed supine on artificial respirator. IMPRESSIONS  1. Left ventricular ejection fraction, by  estimation, is 70 to 75%. The left ventricle has hyperdynamic function. The left ventricle has no regional wall motion abnormalities. There is mild left ventricular  hypertrophy. Left ventricular diastolic parameters are consistent with Grade I diastolic dysfunction (impaired relaxation).  2. Right ventricular systolic function is normal. The right ventricular size is normal.  3. The mitral valve is normal in structure. No evidence of mitral valve regurgitation. No evidence of mitral stenosis.  4. The aortic valve was not well visualized. Aortic valve regurgitation is not visualized. No aortic stenosis is present.  5. The inferior vena cava is normal in size with greater than 50% respiratory variability, suggesting right atrial pressure of 3 mmHg. FINDINGS  Left Ventricle: Left ventricular ejection fraction, by estimation, is 70 to 75%. The left ventricle has hyperdynamic function. The left ventricle has no regional wall motion abnormalities. The left ventricular internal cavity size was normal in size. There is mild left ventricular hypertrophy. Left ventricular diastolic parameters are consistent with Grade I diastolic dysfunction (impaired relaxation). Right Ventricle: The right ventricular size is normal. No increase in right ventricular wall thickness. Right ventricular systolic function is normal. Left Atrium: Left atrial size was normal in size. Right Atrium: Right atrial size was normal in size. Pericardium: There is no evidence of pericardial effusion. Mitral Valve: The mitral valve is normal in structure. No evidence of mitral valve regurgitation. No evidence of mitral valve stenosis. Tricuspid Valve: The tricuspid valve is normal in structure. Tricuspid valve regurgitation is not demonstrated. No evidence of tricuspid stenosis. Aortic Valve: The aortic valve was not well visualized. Aortic valve regurgitation is not visualized. No aortic stenosis is present. Pulmonic Valve: The pulmonic valve was normal in  structure. Pulmonic valve regurgitation is not visualized. No evidence of pulmonic stenosis. Aorta: The aortic root is normal in size and structure. Venous: The inferior vena cava is normal in size with greater than 50% respiratory variability, suggesting right atrial pressure of 3 mmHg. IAS/Shunts: No atrial level shunt detected by color flow Doppler.  LEFT VENTRICLE PLAX 2D LVIDd:         4.50 cm LVIDs:         2.90 cm LV PW:         1.20 cm LV IVS:        1.30 cm LVOT diam:     2.30 cm LV SV:         89 LV SV Index:   36 LVOT Area:     4.15 cm  RIGHT VENTRICLE RV S prime:     16.40 cm/s TAPSE (M-mode): 1.7 cm LEFT ATRIUM           Index       RIGHT ATRIUM           Index LA diam:      3.50 cm 1.44 cm/m  RA Area:     14.20 cm LA Vol (A2C): 58.3 ml 23.93 ml/m RA Volume:   32.80 ml  13.46 ml/m LA Vol (A4C): 53.6 ml 22.00 ml/m  AORTIC VALVE LVOT Vmax:   139.00 cm/s LVOT Vmean:  104.000 cm/s LVOT VTI:    0.214 m  AORTA Ao Root diam: 3.60 cm Ao Asc diam:  3.70 cm MITRAL VALVE MV Area (PHT): 3.34 cm     SHUNTS MV Decel Time: 227 msec     Systemic VTI:  0.21 m MV E velocity: 73.50 cm/s   Systemic Diam: 2.30 cm MV A velocity: 129.00 cm/s MV E/A ratio:  0.57 Donato Schultz MD Electronically signed by Donato Schultz MD Signature Date/Time: 09/27/2020/3:01:00 PM    Final    ECHOCARDIOGRAM LIMITED  Result Date:  10/21/20    ECHOCARDIOGRAM LIMITED REPORT   Patient Name:   Jonathon Patel Date of Exam: 21-Oct-2020 Medical Rec #:  [id number]    Height:       72.0 in Accession #:    [id number]   Weight:       262.3 lb Date of Birth:  Aug 04, 1955    BSA:          2.391 m Patient Age:    64 years     BP:           97/45 mmHg Patient Gender: M            HR:           66 bpm. Exam Location:  Inpatient Procedure: Limited Echo, Cardiac Doppler and Color Doppler          STAT ECHO  Dr. Weston Brass bedside. Indications:     Cardiac arrest  History:         Patient has no prior history of Echocardiogram examinations.                   Risk Factors:Hypertension and Dyslipidemia. 1st degree AV                  block. Thoracic aortic aneurysm.  Sonographer:     Ross Ludwig RDCS (AE) Referring Phys:  1993 [facility] G BARRETT Diagnosing Phys: Weston Brass MD  Sonographer Comments: Patient is morbidly obese. Image acquisition challenging due to patient body habitus. IMPRESSIONS  1. Left ventricular ejection fraction, by estimation, is 55 to 60%. The left ventricle has normal function.  2. Right ventricular systolic function mildly reduced at base and mid ventricle, preserved at apex.. The right ventricular size is normal.  3. The aortic valve is tricuspid. Aortic valve regurgitation is not visualized.  4. Aortic dilatation noted. 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 FINDINGS  Left Ventricle: Left ventricular ejection fraction, by estimation, is 55 to 60%. The left ventricle has normal function. The left ventricular internal cavity size was normal in size. Right Ventricle: The right ventricular size is normal. Right vetricular wall thickness was not well visualized. Right ventricular systolic function mildly reduced at base and mid ventricle, preserved at apex. Pericardium: There is no evidence of pericardial effusion. Aortic Valve: The aortic valve is tricuspid. Aortic valve regurgitation is not visualized. Pulmonic Valve: Pulmonic valve regurgitation is mild. Aorta: Aortic dilatation noted. 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 IVC IVC diam: 2.10 cm  AORTA Ao Asc diam: 4.40 cm Weston Brass MD Electronically signed by Weston Brass MD Signature Date/Time: 10/21/20/10:49:57 AM    Final    CT CHEST ABDOMEN PELVIS WO CONTRAST  Result Date: 09/27/2020 CLINICAL DATA:  Evaluation for organ donation. EXAM: CT CHEST, ABDOMEN AND PELVIS WITHOUT CONTRAST TECHNIQUE: Multidetector CT imaging of the chest, abdomen and pelvis was performed following the standard protocol  without IV contrast. COMPARISON:  Prior CTs Oct 21, 2020 FINDINGS: CT CHEST FINDINGS Cardiovascular: Left IJ central venous catheter extends to the upper SVC. No significant vascular findings on noncontrast imaging. The heart size is normal. There is no pericardial effusion. Mediastinum/Nodes: There are no enlarged mediastinal, hilar or axillary lymph nodes. Endotracheal tube and nasogastric tube in place. The esophagus, trachea and thyroid gland demonstrate no significant findings. Lungs/Pleura: Dependent consolidation in both lungs again noted, slightly progressive. There may be a small amount of pleural fluid on the  right. No pneumothorax. There is mild clustered nodularity within the aerated portions of the right upper and lower lobes. Musculoskeletal/Chest wall: No chest wall mass or suspicious osseous findings. CT ABDOMEN AND PELVIS FINDINGS Hepatobiliary: No focal hepatic abnormalities identified on noncontrast imaging. There is high density bile within the gallbladder lumen. No definite gallstones, gallbladder wall thickening or surrounding inflammation. No biliary dilatation. Pancreas: Unremarkable. No pancreatic ductal dilatation or surrounding inflammatory changes. Spleen: Normal in size without focal abnormality. Adrenals/Urinary Tract: Both adrenal glands appear normal. Mild nonspecific perinephric soft tissue stranding bilaterally. No focal renal lesion, hydronephrosis or urinary tract calculus. Bladder decompressed by a Foley catheter. Stomach/Bowel: The stomach appears unremarkable for its degree of distension. No evidence of bowel wall thickening, distention or surrounding inflammatory change. Nasogastric tube extends to the mid stomach. The appendix appears normal. Vascular/Lymphatic: There are no enlarged abdominal or pelvic lymph nodes. Small retroperitoneal lymph nodes are unchanged. Mild aortic atherosclerosis. No acute vascular findings on noncontrast imaging. Reproductive: The prostate gland  and seminal vesicles appear normal. Other: Small amount of free pelvic fluid. No focal extraluminal fluid collection or free air. Musculoskeletal: No acute or significant osseous findings. Bilateral flank edema. Mild lumbar spondylosis. IMPRESSION: 1. Slightly progressive dependent consolidation in both lungs. Satisfactory position of the enteric and nasogastric tubes. 2. No acute findings within the abdomen or pelvis. Previously demonstrated bowel distension has resolved. No solid parenchymal organ abnormalities identified on noncontrast imaging. 3. Aortic Atherosclerosis (ICD10-I70.0). Electronically Signed   By: Carey Bullocks M.D.   On: 09/27/2020 16:28    Microbiology No results found for this or any previous visit (from the past 240 hour(s)).  Lab Basic Metabolic Panel: No results for input(s): NA, K, CL, CO2, GLUCOSE, BUN, CREATININE, CALCIUM, MG, PHOS in the last 168 hours. Liver Function Tests: No results for input(s): AST, ALT, ALKPHOS, BILITOT, PROT, ALBUMIN in the last 168 hours. No results for input(s): LIPASE, AMYLASE in the last 168 hours. No results for input(s): AMMONIA in the last 168 hours. CBC: No results for input(s): WBC, NEUTROABS, HGB, HCT, MCV, PLT in the last 168 hours. Cardiac Enzymes: No results for input(s): CKTOTAL, CKMB, CKMBINDEX, TROPONINI in the last 168 hours. Sepsis Labs: No results for input(s): PROCALCITON, WBC, LATICACIDVEN in the last 168 hours.   Lorin Glass 10/11/2020, 1:14 PM

## 2020-11-13 LAB — ACID FAST CULTURE WITH REFLEXED SENSITIVITIES (MYCOBACTERIA): Acid Fast Culture: NEGATIVE
